# Patient Record
Sex: Female | Born: 1975 | Hispanic: Yes | Smoking: Former smoker
Health system: Southern US, Community
[De-identification: ages and names within clinical notes are randomized; demographics above are authoritative.]

## PROBLEM LIST (undated history)

## (undated) DIAGNOSIS — L309 Dermatitis, unspecified: Secondary | ICD-10-CM

## (undated) DIAGNOSIS — L509 Urticaria, unspecified: Secondary | ICD-10-CM

## (undated) DIAGNOSIS — I10 Essential (primary) hypertension: Secondary | ICD-10-CM

## (undated) DIAGNOSIS — J45909 Unspecified asthma, uncomplicated: Secondary | ICD-10-CM

## (undated) HISTORY — DX: Unspecified asthma, uncomplicated: J45.909

## (undated) HISTORY — DX: Urticaria, unspecified: L50.9

## (undated) HISTORY — DX: Dermatitis, unspecified: L30.9

---

## 2015-12-30 DIAGNOSIS — F4310 Post-traumatic stress disorder, unspecified: Secondary | ICD-10-CM | POA: Insufficient documentation

## 2016-02-10 DIAGNOSIS — F4310 Post-traumatic stress disorder, unspecified: Secondary | ICD-10-CM | POA: Diagnosis not present

## 2016-02-10 DIAGNOSIS — F331 Major depressive disorder, recurrent, moderate: Secondary | ICD-10-CM | POA: Diagnosis not present

## 2016-02-10 DIAGNOSIS — Z590 Homelessness: Secondary | ICD-10-CM | POA: Diagnosis not present

## 2016-02-16 DIAGNOSIS — F4323 Adjustment disorder with mixed anxiety and depressed mood: Secondary | ICD-10-CM | POA: Diagnosis not present

## 2016-02-27 DIAGNOSIS — Z590 Homelessness: Secondary | ICD-10-CM | POA: Diagnosis not present

## 2016-02-27 DIAGNOSIS — F4310 Post-traumatic stress disorder, unspecified: Secondary | ICD-10-CM | POA: Diagnosis not present

## 2016-02-27 DIAGNOSIS — F331 Major depressive disorder, recurrent, moderate: Secondary | ICD-10-CM | POA: Diagnosis not present

## 2016-03-09 DIAGNOSIS — F4323 Adjustment disorder with mixed anxiety and depressed mood: Secondary | ICD-10-CM | POA: Diagnosis not present

## 2016-03-22 DIAGNOSIS — F4323 Adjustment disorder with mixed anxiety and depressed mood: Secondary | ICD-10-CM | POA: Diagnosis not present

## 2016-04-11 DIAGNOSIS — G932 Benign intracranial hypertension: Secondary | ICD-10-CM | POA: Diagnosis not present

## 2016-04-11 DIAGNOSIS — F331 Major depressive disorder, recurrent, moderate: Secondary | ICD-10-CM | POA: Diagnosis not present

## 2016-04-11 DIAGNOSIS — Z590 Homelessness: Secondary | ICD-10-CM | POA: Diagnosis not present

## 2016-04-11 DIAGNOSIS — F4310 Post-traumatic stress disorder, unspecified: Secondary | ICD-10-CM | POA: Diagnosis not present

## 2016-04-12 DIAGNOSIS — Z91410 Personal history of adult physical and sexual abuse: Secondary | ICD-10-CM | POA: Diagnosis not present

## 2016-04-12 DIAGNOSIS — F331 Major depressive disorder, recurrent, moderate: Secondary | ICD-10-CM | POA: Diagnosis not present

## 2016-04-12 DIAGNOSIS — F4310 Post-traumatic stress disorder, unspecified: Secondary | ICD-10-CM | POA: Diagnosis not present

## 2016-04-13 DIAGNOSIS — Z713 Dietary counseling and surveillance: Secondary | ICD-10-CM | POA: Diagnosis not present

## 2016-04-13 DIAGNOSIS — J302 Other seasonal allergic rhinitis: Secondary | ICD-10-CM | POA: Diagnosis not present

## 2016-04-13 DIAGNOSIS — J4540 Moderate persistent asthma, uncomplicated: Secondary | ICD-10-CM | POA: Diagnosis not present

## 2016-04-13 DIAGNOSIS — E538 Deficiency of other specified B group vitamins: Secondary | ICD-10-CM | POA: Diagnosis not present

## 2016-04-13 DIAGNOSIS — Z7182 Exercise counseling: Secondary | ICD-10-CM | POA: Diagnosis not present

## 2016-04-13 DIAGNOSIS — E559 Vitamin D deficiency, unspecified: Secondary | ICD-10-CM | POA: Diagnosis not present

## 2016-04-13 DIAGNOSIS — Z6841 Body Mass Index (BMI) 40.0 and over, adult: Secondary | ICD-10-CM | POA: Diagnosis not present

## 2016-04-25 DIAGNOSIS — F331 Major depressive disorder, recurrent, moderate: Secondary | ICD-10-CM | POA: Diagnosis not present

## 2016-04-25 DIAGNOSIS — Z590 Homelessness: Secondary | ICD-10-CM | POA: Diagnosis not present

## 2016-04-25 DIAGNOSIS — G932 Benign intracranial hypertension: Secondary | ICD-10-CM | POA: Diagnosis not present

## 2016-04-25 DIAGNOSIS — F4310 Post-traumatic stress disorder, unspecified: Secondary | ICD-10-CM | POA: Diagnosis not present

## 2016-04-26 DIAGNOSIS — F4310 Post-traumatic stress disorder, unspecified: Secondary | ICD-10-CM | POA: Diagnosis not present

## 2016-04-26 DIAGNOSIS — F331 Major depressive disorder, recurrent, moderate: Secondary | ICD-10-CM | POA: Diagnosis not present

## 2016-05-07 DIAGNOSIS — F4310 Post-traumatic stress disorder, unspecified: Secondary | ICD-10-CM | POA: Diagnosis not present

## 2016-05-07 DIAGNOSIS — F331 Major depressive disorder, recurrent, moderate: Secondary | ICD-10-CM | POA: Diagnosis not present

## 2016-05-09 DIAGNOSIS — F4310 Post-traumatic stress disorder, unspecified: Secondary | ICD-10-CM | POA: Diagnosis not present

## 2016-05-09 DIAGNOSIS — G932 Benign intracranial hypertension: Secondary | ICD-10-CM | POA: Diagnosis not present

## 2016-05-09 DIAGNOSIS — F4323 Adjustment disorder with mixed anxiety and depressed mood: Secondary | ICD-10-CM | POA: Diagnosis not present

## 2016-05-09 DIAGNOSIS — F331 Major depressive disorder, recurrent, moderate: Secondary | ICD-10-CM | POA: Diagnosis not present

## 2016-05-09 DIAGNOSIS — Z590 Homelessness: Secondary | ICD-10-CM | POA: Diagnosis not present

## 2016-05-10 DIAGNOSIS — F331 Major depressive disorder, recurrent, moderate: Secondary | ICD-10-CM | POA: Diagnosis not present

## 2016-05-10 DIAGNOSIS — F4310 Post-traumatic stress disorder, unspecified: Secondary | ICD-10-CM | POA: Diagnosis not present

## 2016-06-04 DIAGNOSIS — F902 Attention-deficit hyperactivity disorder, combined type: Secondary | ICD-10-CM | POA: Diagnosis not present

## 2016-06-06 DIAGNOSIS — F4323 Adjustment disorder with mixed anxiety and depressed mood: Secondary | ICD-10-CM | POA: Diagnosis not present

## 2016-06-06 DIAGNOSIS — G932 Benign intracranial hypertension: Secondary | ICD-10-CM | POA: Diagnosis not present

## 2016-06-06 DIAGNOSIS — F4310 Post-traumatic stress disorder, unspecified: Secondary | ICD-10-CM | POA: Diagnosis not present

## 2016-06-06 DIAGNOSIS — F331 Major depressive disorder, recurrent, moderate: Secondary | ICD-10-CM | POA: Diagnosis not present

## 2016-06-06 DIAGNOSIS — Z590 Homelessness: Secondary | ICD-10-CM | POA: Diagnosis not present

## 2016-06-12 DIAGNOSIS — Z114 Encounter for screening for human immunodeficiency virus [HIV]: Secondary | ICD-10-CM | POA: Diagnosis not present

## 2016-06-12 DIAGNOSIS — Z118 Encounter for screening for other infectious and parasitic diseases: Secondary | ICD-10-CM | POA: Diagnosis not present

## 2016-06-12 DIAGNOSIS — L700 Acne vulgaris: Secondary | ICD-10-CM | POA: Diagnosis not present

## 2016-06-12 DIAGNOSIS — J45909 Unspecified asthma, uncomplicated: Secondary | ICD-10-CM | POA: Diagnosis not present

## 2016-06-12 DIAGNOSIS — Z0000 Encounter for general adult medical examination without abnormal findings: Secondary | ICD-10-CM | POA: Diagnosis not present

## 2016-06-15 DIAGNOSIS — F331 Major depressive disorder, recurrent, moderate: Secondary | ICD-10-CM | POA: Diagnosis not present

## 2016-06-15 DIAGNOSIS — F902 Attention-deficit hyperactivity disorder, combined type: Secondary | ICD-10-CM | POA: Diagnosis not present

## 2016-06-15 DIAGNOSIS — F4310 Post-traumatic stress disorder, unspecified: Secondary | ICD-10-CM | POA: Diagnosis not present

## 2016-06-28 DIAGNOSIS — F331 Major depressive disorder, recurrent, moderate: Secondary | ICD-10-CM | POA: Diagnosis not present

## 2016-06-28 DIAGNOSIS — F4310 Post-traumatic stress disorder, unspecified: Secondary | ICD-10-CM | POA: Diagnosis not present

## 2016-06-28 DIAGNOSIS — F902 Attention-deficit hyperactivity disorder, combined type: Secondary | ICD-10-CM | POA: Diagnosis not present

## 2016-07-12 DIAGNOSIS — F331 Major depressive disorder, recurrent, moderate: Secondary | ICD-10-CM | POA: Diagnosis not present

## 2016-07-12 DIAGNOSIS — F4310 Post-traumatic stress disorder, unspecified: Secondary | ICD-10-CM | POA: Diagnosis not present

## 2016-07-12 DIAGNOSIS — F902 Attention-deficit hyperactivity disorder, combined type: Secondary | ICD-10-CM | POA: Diagnosis not present

## 2016-09-27 DIAGNOSIS — G932 Benign intracranial hypertension: Secondary | ICD-10-CM | POA: Diagnosis not present

## 2016-09-27 DIAGNOSIS — J029 Acute pharyngitis, unspecified: Secondary | ICD-10-CM | POA: Diagnosis not present

## 2016-09-27 DIAGNOSIS — M797 Fibromyalgia: Secondary | ICD-10-CM | POA: Diagnosis not present

## 2016-09-27 DIAGNOSIS — Z880 Allergy status to penicillin: Secondary | ICD-10-CM | POA: Diagnosis not present

## 2016-11-23 DIAGNOSIS — Z880 Allergy status to penicillin: Secondary | ICD-10-CM | POA: Diagnosis not present

## 2016-11-23 DIAGNOSIS — J209 Acute bronchitis, unspecified: Secondary | ICD-10-CM | POA: Diagnosis not present

## 2016-11-23 DIAGNOSIS — M797 Fibromyalgia: Secondary | ICD-10-CM | POA: Diagnosis not present

## 2016-11-23 DIAGNOSIS — J45909 Unspecified asthma, uncomplicated: Secondary | ICD-10-CM | POA: Diagnosis not present

## 2017-02-13 ENCOUNTER — Emergency Department (HOSPITAL_COMMUNITY)
Admission: EM | Admit: 2017-02-13 | Discharge: 2017-02-13 | Disposition: A | Discharge status: Home / Self Care | Payer: Medicare Other | Attending: Emergency Medicine | Admitting: Emergency Medicine

## 2017-02-13 ENCOUNTER — Encounter (HOSPITAL_COMMUNITY): Payer: Self-pay

## 2017-02-13 DIAGNOSIS — Y999 Unspecified external cause status: Secondary | ICD-10-CM | POA: Diagnosis not present

## 2017-02-13 DIAGNOSIS — Y93G3 Activity, cooking and baking: Secondary | ICD-10-CM | POA: Diagnosis not present

## 2017-02-13 DIAGNOSIS — Y92000 Kitchen of unspecified non-institutional (private) residence as  the place of occurrence of the external cause: Secondary | ICD-10-CM | POA: Diagnosis not present

## 2017-02-13 DIAGNOSIS — X18XXXA Contact with other hot metals, initial encounter: Secondary | ICD-10-CM | POA: Insufficient documentation

## 2017-02-13 DIAGNOSIS — T23232A Burn of second degree of multiple left fingers (nail), not including thumb, initial encounter: Secondary | ICD-10-CM | POA: Insufficient documentation

## 2017-02-13 DIAGNOSIS — T23152A Burn of first degree of left palm, initial encounter: Secondary | ICD-10-CM | POA: Insufficient documentation

## 2017-02-13 DIAGNOSIS — T23192A Burn of first degree of multiple sites of left wrist and hand, initial encounter: Secondary | ICD-10-CM

## 2017-02-13 HISTORY — DX: Essential (primary) hypertension: I10

## 2017-02-13 MED ORDER — TRAMADOL HCL 50 MG PO TABS
50.0000 mg | ORAL_TABLET | Freq: Once | ORAL | Status: AC
  Administered 2017-02-13: 50 mg via ORAL
  Filled 2017-02-13: qty 1

## 2017-02-13 MED ORDER — SILVER SULFADIAZINE 1 % EX CREA
TOPICAL_CREAM | Freq: Once | CUTANEOUS | Status: AC
  Administered 2017-02-13: 03:00:00 via TOPICAL
  Filled 2017-02-13: qty 50

## 2017-02-13 MED ORDER — KETOROLAC TROMETHAMINE 60 MG/2ML IM SOLN
60.0000 mg | Freq: Once | INTRAMUSCULAR | Status: AC
  Administered 2017-02-13: 60 mg via INTRAMUSCULAR
  Filled 2017-02-13: qty 2

## 2017-02-13 MED ORDER — HYDROCODONE-ACETAMINOPHEN 5-325 MG PO TABS: 1.0000 | ORAL_TABLET | Freq: Four times a day (QID) | ORAL | 0 refills | Status: DC | PRN

## 2017-02-13 MED ORDER — TRAMADOL HCL 50 MG PO TABS: 50.0000 mg | ORAL_TABLET | Freq: Four times a day (QID) | ORAL | 0 refills | Status: DC | PRN

## 2017-02-13 NOTE — ED Notes (Signed)
Bed: WTR7 Expected date:  Expected time:  Means of arrival:  Comments: 

## 2017-02-13 NOTE — ED Provider Notes (Signed)
  [facility]-EMERGENCY DEPT Provider Note   CSN: [id number] Arrival date & time: 02/13/17  0211     History   Chief Complaint Chief Complaint  Patient presents with  . Hand Burn    HPI Christine Hanson is a 42 y.o. female.  Patient is a 42 year old female with a burn to her left hand.  She was pulling a hot baking pan out of the oven when she inadvertently touched the bottom of the pan.  She has burns to the palm of her hand and finger pads of all fingers.   The history is provided by the patient.    Past Medical History:  Diagnosis Date  . Hypertension     There are no active problems to display for this patient.   History reviewed. No pertinent surgical history.  OB History    No data available       Home Medications    Prior to Admission medications   Not on File    Family History History reviewed. No pertinent family history.  Social History Social History   Tobacco Use  . Smoking status: Never Smoker  . Smokeless tobacco: Never Used  Substance Use Topics  . Alcohol use: No    Frequency: Never  . Drug use: No     Allergies   Patient has no allergy information on record.   Review of Systems Review of Systems  All other systems reviewed and are negative.    Physical Exam Updated Vital Signs BP (!) 106/95   Pulse 81   Temp 98.4 F (36.9 C) (Oral)   LMP 02/11/2017   SpO2 100%   Physical Exam  Constitutional: She is oriented to person, place, and time. She appears well-developed and well-nourished. No distress.  HENT:  Head: Normocephalic and atraumatic.  Neck: Normal range of motion. Neck supple.  Neurological: She is alert and oriented to person, place, and time.  Skin: Skin is warm and dry. She is not diaphoretic.  The palmar aspect of the left hand has erythema consistent with first-degree burns.  There are also areas of mild blistering to the finger pad of the fourth and fifth fingers consistent with  mild second-degree burns.  Nursing note and vitals reviewed.    ED Treatments / Results  Labs (all labs ordered are listed, but only abnormal results are displayed) Labs Reviewed - No data to display  EKG  EKG Interpretation None       Radiology No results found.  Procedures Procedures (including critical care time)  Medications Ordered in ED Medications  silver sulfADIAZINE (SILVADENE) 1 % cream (not administered)  traMADol (ULTRAM) tablet 50 mg (not administered)     Initial Impression / Assessment and Plan / ED Course  I have reviewed the triage vital signs and the nursing notes.  Pertinent labs & imaging results that were available during my care of the patient were reviewed by me and considered in my medical decision making (see chart for details).  This will be treated with local wound care, tramadol, and follow-up as needed.  Final Clinical Impressions(s) / ED Diagnoses   Final diagnoses:  None    ED Discharge Orders    None       Geoffery Lyonselo, Anikka Marsan, MD 02/13/17 [phone]

## 2017-02-13 NOTE — Discharge Instructions (Signed)
Local wound care with Silvadene and dressing changes twice daily.  Ibuprofen 600mg  every six hours as prescribed as needed for pain.  Hydrocodone as prescribed as needed for pain not relieved with ibuprofen.  Follow-up with your primary doctor if not improving in the next 3-4 days.

## 2017-02-13 NOTE — ED Triage Notes (Signed)
Pt burned the tips of her left fingers and palm of hand on an oven rack

## 2017-02-14 ENCOUNTER — Ambulatory Visit (INDEPENDENT_AMBULATORY_CARE_PROVIDER_SITE_OTHER): Payer: PRIVATE HEALTH INSURANCE | Admitting: Neurology

## 2017-02-14 ENCOUNTER — Encounter: Payer: Self-pay | Admitting: Neurology

## 2017-02-14 VITALS — BP 134/69 | HR 74 | Ht 67.0 in | Wt 267.0 lb

## 2017-02-14 DIAGNOSIS — M797 Fibromyalgia: Secondary | ICD-10-CM

## 2017-02-14 DIAGNOSIS — G932 Benign intracranial hypertension: Secondary | ICD-10-CM | POA: Diagnosis not present

## 2017-02-14 MED ORDER — TOPIRAMATE 50 MG PO TABS: 100.0000 mg | ORAL_TABLET | Freq: Every day | ORAL | 11 refills | Status: DC

## 2017-02-14 MED ORDER — RIZATRIPTAN BENZOATE 10 MG PO TBDP: 10.0000 mg | ORAL_TABLET | ORAL | 6 refills | Status: DC | PRN

## 2017-02-14 MED ORDER — PREGABALIN 100 MG PO CAPS: 100.0000 mg | ORAL_CAPSULE | Freq: Every day | ORAL | 5 refills | Status: DC

## 2017-02-14 NOTE — Progress Notes (Signed)
PATIENT: Christine Hanson DOB: [birth year]  Chief Complaint  Patient presents with  . New Patient (Initial Visit)     HISTORICAL  Christine Hanson is a 42 year old female, seen in refer by vocational rehabilitation service for evaluation of possible intracranial hypertension, initial evaluation was on February 14, 2017.  She recently moved from [place] to [place] in November 2018, she is here to establish her care, she was diagnosed with intracranial hypertension since 2005, also fibromyalgia, was on disability,  Per patient, who presented with frequent headaches, decreased peripheral visual field, there was CSF leaking from her ears, diagnosis of intracranial hypertension was confirmed by repetitive lumbar puncture, she has multiple hospital admission, repeat lumbar puncture 4-5 times each year initially, she was also treated with Diamox 500 mg daily, eventually was tapered of around 2017, because of her stable symptoms, no significant visual complaints anymore.  She was also treated with Lyrica up to 900 mg daily carried a diagnosis of fibromyalgia, she suffered morbid obesity, is seen by the metabolic clinic, with her diet control, she has 30 weight loss over the past few years.  She now complains of frequent headaches, bilateral frontal, temporal region severe pounding headaches with associated light noise sensitivity, nauseous, for that reason, she is taking daily Sudafed, allergy pill, herb teas which has helped her headache some   REVIEW OF SYSTEMS: Full 14 system review of systems performed and notable only for restless leg, joint pain, cramps, aching muscles, feeling cold, ringing the ears   ALLERGIES: Allergies not on file  HOME MEDICATIONS: Current Outpatient Medications  Medication Sig Dispense Refill  . HYDROcodone-acetaminophen (NORCO) 5-325 MG tablet Take 1-2 tablets by mouth every 6 (six) hours as needed. 10 tablet 0   No current  facility-administered medications for this visit.     PAST MEDICAL HISTORY: Past Medical History:  Diagnosis Date  . Hypertension     PAST SURGICAL HISTORY: No past surgical history on file.  FAMILY HISTORY: No family history on file.  SOCIAL HISTORY:  Social History   Socioeconomic History  . Marital status: Single    Spouse name: Not on file  . Number of children: Not on file  . Years of education: Not on file  . Highest education level: Not on file  Social Needs  . Financial resource strain: Not on file  . Food insecurity - worry: Not on file  . Food insecurity - inability: Not on file  . Transportation needs - medical: Not on file  . Transportation needs - non-medical: Not on file  Occupational History  . Not on file  Tobacco Use  . Smoking status: Never Smoker  . Smokeless tobacco: Never Used  Substance and Sexual Activity  . Alcohol use: No    Frequency: Never  . Drug use: No  . Sexual activity: Not on file  Other Topics Concern  . Not on file  Social History Narrative  . Not on file     PHYSICAL EXAM   Vitals:   02/14/17 1503  BP: 134/69  Pulse: 74  Weight: 267 lb (121.1 kg)  Height: 5\' 7"  (1.702 m)    Not recorded      Body mass index is 41.82 kg/m.  PHYSICAL EXAMNIATION:  Gen: NAD, conversant, well nourised, obese, well groomed                     Cardiovascular: Regular rate rhythm, no peripheral edema, warm, nontender. Eyes: Conjunctivae clear without exudates  or hemorrhage Neck: Supple, no carotid bruits. Pulmonary: Clear to auscultation bilaterally   NEUROLOGICAL EXAM:  MENTAL STATUS: Speech:    Speech is normal; fluent and spontaneous with normal comprehension.  Cognition:     Orientation to time, place and person     Normal recent and remote memory     Normal Attention span and concentration     Normal Language, naming, repeating,spontaneous speech     Fund of knowledge   CRANIAL NERVES: CN II: Visual fields are full  to confrontation. Fundoscopic exam is normal with sharp discs and no vascular changes. Pupils are round equal and briskly reactive to light. CN III, IV, VI: extraocular movement are normal. No ptosis. CN V: Facial sensation is intact to pinprick in all 3 divisions bilaterally. Corneal responses are intact.  CN VII: Face is symmetric with normal eye closure and smile. CN VIII: Hearing is normal to rubbing fingers CN IX, X: Palate elevates symmetrically. Phonation is normal. CN XI: Head turning and shoulder shrug are intact CN XII: Tongue is midline with normal movements and no atrophy.  MOTOR: There is no pronator drift of out-stretched arms. Muscle bulk and tone are normal. Muscle strength is normal.  REFLEXES: Reflexes are 2+ and symmetric at the biceps, triceps, knees, and ankles. Plantar responses are flexor.  SENSORY: Intact to light touch, pinprick, positional sensation and vibratory sensation are intact in fingers and toes.  COORDINATION: Rapid alternating movements and fine finger movements are intact. There is no dysmetria on finger-to-nose and heel-knee-shin.    GAIT/STANCE: Posture is normal. Gait is steady with normal steps, base, arm swing, and turning. Heel and toe walking are normal. Tandem gait is normal.  Romberg is absent.   DIAGNOSTIC DATA (LABS, IMAGING, TESTING) - I reviewed patient records, labs, notes, testing and imaging myself where available.   ASSESSMENT AND PLAN  Christine Hanson is a 42 y.o. female   History of pseudotumor febrile, now has frequent headache with migraine features Fibromyalgia  Topamax 50 mg titrating to 100 mg every night as a preventive medication  Laboratory evaluation to rule out treatable etiology  Lyrica 100 mg every night  Maxalt 10 mg as needed    Levert FeinsteinYijun Cordaro Mukai, M.D. Ph.D.  [facility] [address], [address] [place], [place] [address] Ph: [phone] Fax: [phone]  CC:  Vocational  Rehabllitation Service, phone #[phone], fax #[phone]

## 2017-02-15 ENCOUNTER — Telehealth: Payer: Self-pay | Admitting: Neurology

## 2017-02-15 LAB — COMPREHENSIVE METABOLIC PANEL
A/G RATIO: 1.2 (ref 1.2–2.2)
ALK PHOS: 77 IU/L (ref 39–117)
ALT: 16 IU/L (ref 0–32)
AST: 25 IU/L (ref 0–40)
Albumin: 3.9 g/dL (ref 3.5–5.5)
BUN/Creatinine Ratio: 15 (ref 9–23)
BUN: 9 mg/dL (ref 6–24)
Bilirubin Total: <0.2 mg/dL (ref 0.0–1.2)
CHLORIDE: 99 mmol/L (ref 96–106)
CO2: 26 mmol/L (ref 20–29)
Calcium: 9.4 mg/dL (ref 8.7–10.2)
Creatinine, Ser: 0.59 mg/dL (ref 0.57–1.00)
GFR calc non Af Amer: 113 mL/min/{1.73_m2} (ref >59)
GFR, EST AFRICAN AMERICAN: 130 mL/min/{1.73_m2} (ref >59)
Globulin, Total: 3.2 g/dL (ref 1.5–4.5)
Glucose: 93 mg/dL (ref 65–99)
POTASSIUM: 4.4 mmol/L (ref 3.5–5.2)
Sodium: 141 mmol/L (ref 134–144)
Total Protein: 7.1 g/dL (ref 6.0–8.5)

## 2017-02-15 LAB — CBC WITH DIFFERENTIAL/PLATELET
BASOS: 0 %
Basophils Absolute: 0 10*3/uL (ref 0.0–0.2)
EOS (ABSOLUTE): 0.1 10*3/uL (ref 0.0–0.4)
Eos: 1 %
Hematocrit: 38.4 % (ref 34.0–46.6)
Hemoglobin: 12.7 g/dL (ref 11.1–15.9)
Immature Grans (Abs): 0 10*3/uL (ref 0.0–0.1)
Immature Granulocytes: 0 %
Lymphocytes Absolute: 2.8 10*3/uL (ref 0.7–3.1)
Lymphs: 32 %
MCH: 26.5 pg — AB (ref 26.6–33.0)
MCHC: 33.1 g/dL (ref 31.5–35.7)
MCV: 80 fL (ref 79–97)
MONOS ABS: 0.4 10*3/uL (ref 0.1–0.9)
Monocytes: 5 %
NEUTROS ABS: 5.3 10*3/uL (ref 1.4–7.0)
NEUTROS PCT: 62 %
PLATELETS: 167 10*3/uL (ref 150–379)
RBC: 4.79 x10E6/uL (ref 3.77–5.28)
RDW: 15.2 % (ref 12.3–15.4)
WBC: 8.7 10*3/uL (ref 3.4–10.8)

## 2017-02-15 LAB — SEDIMENTATION RATE: Sed Rate: 29 mm/hr (ref 0–32)

## 2017-02-15 LAB — VITAMIN B12: Vitamin B-12: 255 pg/mL (ref 232–1245)

## 2017-02-15 LAB — C-REACTIVE PROTEIN: CRP: 14.7 mg/L — ABNORMAL HIGH (ref 0.0–4.9)

## 2017-02-15 LAB — ANA W/REFLEX: ANA: NEGATIVE

## 2017-02-15 LAB — VITAMIN D 25 HYDROXY (VIT D DEFICIENCY, FRACTURES): Vit D, 25-Hydroxy: 33.9 ng/mL (ref 30.0–100.0)

## 2017-02-15 LAB — TSH: TSH: 3.53 u[IU]/mL (ref 0.450–4.500)

## 2017-02-15 LAB — CK: CK TOTAL: 150 U/L (ref 24–173)

## 2017-02-15 NOTE — Telephone Encounter (Signed)
Laboratory evaluation showed mild elevated C-reactive protein, which is unknown clinical significance, rest of the laboratory evaluations were normal.

## 2017-02-18 NOTE — Telephone Encounter (Signed)
Spoke to patient she is aware of results

## 2017-03-20 ENCOUNTER — Ambulatory Visit: Payer: PRIVATE HEALTH INSURANCE | Admitting: Neurology

## 2017-04-01 ENCOUNTER — Ambulatory Visit (INDEPENDENT_AMBULATORY_CARE_PROVIDER_SITE_OTHER): Payer: Medicare Other | Admitting: Neurology

## 2017-04-01 ENCOUNTER — Encounter: Payer: Self-pay | Admitting: Neurology

## 2017-04-01 VITALS — BP 144/96 | HR 92 | Ht 67.0 in | Wt 267.5 lb

## 2017-04-01 DIAGNOSIS — R51 Headache: Secondary | ICD-10-CM

## 2017-04-01 DIAGNOSIS — G932 Benign intracranial hypertension: Secondary | ICD-10-CM | POA: Diagnosis not present

## 2017-04-01 DIAGNOSIS — G43709 Chronic migraine without aura, not intractable, without status migrainosus: Secondary | ICD-10-CM | POA: Diagnosis not present

## 2017-04-01 DIAGNOSIS — IMO0002 Reserved for concepts with insufficient information to code with codable children: Secondary | ICD-10-CM

## 2017-04-01 DIAGNOSIS — R519 Headache, unspecified: Secondary | ICD-10-CM

## 2017-04-01 MED ORDER — RIZATRIPTAN BENZOATE 10 MG PO TBDP: 10.0000 mg | ORAL_TABLET | ORAL | 6 refills | Status: DC | PRN

## 2017-04-01 MED ORDER — TOPIRAMATE 50 MG PO TABS: 100.0000 mg | ORAL_TABLET | Freq: Every day | ORAL | 11 refills | Status: DC

## 2017-04-01 MED ORDER — PREGABALIN 100 MG PO CAPS: 100.0000 mg | ORAL_CAPSULE | Freq: Every day | ORAL | 5 refills | Status: DC

## 2017-04-01 NOTE — Progress Notes (Signed)
PATIENT: Christine Hanson DOB: Sep 15, 1975  Chief Complaint  Patient presents with  . Follow-up    Pt has not established with a PCP yet. Pt goes to [facility] for her [place], see Dr. Joneen Caraway.   . Intracranial hypertension    Patient had a severe allergic to Lamictal. Behavioral Health would like Dr. Rhea Belton opinion on what she can take with her [facility]  . Headache    Pt reports having a mild headache every day.   . Other    Pt reports that when she feels her pressure going up, her right ear will start draining fluid.      HISTORICAL  Christine Hanson is a 42 year old female, seen in refer by vocational rehabilitation service for evaluation of possible intracranial hypertension, initial evaluation was on February 14, 2017.  She recently moved from [place] to [place] in November 2018, she is here to establish her care, she was diagnosed with intracranial hypertension since 2005, also fibromyalgia, was on disability,  Per patient, who presented with frequent headaches, decreased peripheral visual field, there was CSF leaking from her ears, diagnosis of intracranial hypertension was confirmed by repetitive lumbar puncture, she has multiple hospital admission, repeat lumbar puncture 4-5 times each year initially, she was also treated with Diamox 500 mg daily, eventually was tapered of around 2017, because of her stable symptoms, no significant visual complaints anymore.  She was also treated with Lyrica up to 900 mg daily carried a diagnosis of fibromyalgia, she suffered morbid obesity, is seen by the metabolic clinic, with her diet control, she has 30 weight loss over the past few years.  She now complains of frequent headaches, bilateral frontal, temporal region severe pounding headaches with associated light noise sensitivity, nauseous, for that reason, she is taking daily Sudafed, allergy pill, herb teas have helped her headache some  UPDATE April 01 2017: Laboratory evaluations in  January 2019 showed normal CPK, TSH, ESR, ANA, vitamin D, B12, CMP, CBC -reactive protein was mildly elevated at 14.7,  She was seen by behavior health, was started on wellbutrin '400mg'$  daily with diagnosis of depression.  She was treated with lamotrigine, she developed rash.  Previously Topamax '150mg'$  qhs, she felt stuttering, could nto rember her wors.    I have given her a prescription of Lyrica, Maxalt as needed, and Topamax prescription at last visit in January 2019, she did not feel any of the prescription, continue complaints of frequent headaches, seem to correlate with his right ear draining,  I also feel the vocational rehabilitation paperwork for her today,  REVIEW OF SYSTEMS: Full 14 system review of systems performed and notable only for restless leg, joint pain, cramps, aching muscles, feeling cold, ringing the ears   ALLERGIES: Allergies  Allergen Reactions  . Lamictal [Lamotrigine]     Severe migraine AND rash    HOME MEDICATIONS: Current Outpatient Medications  Medication Sig Dispense Refill  . buPROPion (WELLBUTRIN SR) 100 MG 12 hr tablet Take 100 mg by mouth daily.    Marland Kitchen buPROPion (WELLBUTRIN XL) 300 MG 24 hr tablet Take 300 mg by mouth daily.    . pregabalin (LYRICA) 100 MG capsule Take 1 capsule (100 mg total) by mouth at bedtime. (Patient not taking: Reported on 04/01/2017) 30 capsule 5  . rizatriptan (MAXALT-MLT) 10 MG disintegrating tablet Take 1 tablet (10 mg total) by mouth as needed. May repeat in 2 hours if needed (Patient not taking: Reported on 04/01/2017) 15 tablet 6  . topiramate (TOPAMAX) 50  MG tablet Take 2 tablets (100 mg total) by mouth at bedtime. (Patient not taking: Reported on 04/01/2017) 60 tablet 11   No current facility-administered medications for this visit.     PAST MEDICAL HISTORY: Past Medical History:  Diagnosis Date  . Hypertension     PAST SURGICAL HISTORY: No past surgical history on file.  FAMILY HISTORY: No family history on  file.  SOCIAL HISTORY:  Social History   Socioeconomic History  . Marital status: Single    Spouse name: Not on file  . Number of children: Not on file  . Years of education: Not on file  . Highest education level: Not on file  Social Needs  . Financial resource strain: Not on file  . Food insecurity - worry: Not on file  . Food insecurity - inability: Not on file  . Transportation needs - medical: Not on file  . Transportation needs - non-medical: Not on file  Occupational History  . Not on file  Tobacco Use  . Smoking status: Never Smoker  . Smokeless tobacco: Never Used  Substance and Sexual Activity  . Alcohol use: No    Frequency: Never  . Drug use: No  . Sexual activity: Not on file  Other Topics Concern  . Not on file  Social History Narrative  . Not on file     PHYSICAL EXAM   Vitals:   04/01/17 1057  BP: (!) 144/96  Pulse: 92  Weight: 267 lb 8 oz (121.3 kg)  Height: '5\' 7"'$  (1.702 m)    Not recorded      Body mass index is 41.9 kg/m.  PHYSICAL EXAMNIATION:  Gen: NAD, conversant, well nourised, obese, well groomed                     Cardiovascular: Regular rate rhythm, no peripheral edema, warm, nontender. Eyes: Conjunctivae clear without exudates or hemorrhage Neck: Supple, no carotid bruits. Pulmonary: Clear to auscultation bilaterally   NEUROLOGICAL EXAM:  MENTAL STATUS: Speech:    Speech is normal; fluent and spontaneous with normal comprehension.  Cognition:     Orientation to time, place and person     Normal recent and remote memory     Normal Attention span and concentration     Normal Language, naming, repeating,spontaneous speech     Fund of knowledge   CRANIAL NERVES: CN II: Visual fields are full to confrontation. Fundoscopic exam is normal with sharp discs and no vascular changes. Pupils are round equal and briskly reactive to light. CN III, IV, VI: extraocular movement are normal. No ptosis. CN V: Facial sensation is  intact to pinprick in all 3 divisions bilaterally. Corneal responses are intact.  CN VII: Face is symmetric with normal eye closure and smile. CN VIII: Right ear external auditory canal oily cerumen,  CN IX, X: Palate elevates symmetrically. Phonation is normal. CN XI: Head turning and shoulder shrug are intact CN XII: Tongue is midline with normal movements and no atrophy.  MOTOR: There is no pronator drift of out-stretched arms. Muscle bulk and tone are normal. Muscle strength is normal.  REFLEXES: Reflexes are 2+ and symmetric at the biceps, triceps, knees, and ankles. Plantar responses are flexor.  SENSORY: Intact to light touch, pinprick, positional sensation and vibratory sensation are intact in fingers and toes.  COORDINATION: Rapid alternating movements and fine finger movements are intact. There is no dysmetria on finger-to-nose and heel-knee-shin.    GAIT/STANCE: Posture is normal. Gait is steady  with normal steps, base, arm swing, and turning. Heel and toe walking are normal. Tandem gait is normal.  Romberg is absent.   DIAGNOSTIC DATA (LABS, IMAGING, TESTING) - I reviewed patient records, labs, notes, testing and imaging myself where available.   ASSESSMENT AND PLAN  Christine Hanson is a 42 y.o. female   History of pseudotumor cerebri,now has frequent headache with migraine features Fibromyalgia  Topamax 50 mg titrating to 100 mg every night as a preventive medication  Lyrica 100 mg every night  Maxalt 10 mg as needed  MRI of brain, potential lumbar puncture if she still has frequent headaches, fundoscopy examination showed no significant papillary edema    Marcial Pacas, M.D. Ph.D.  [facility] [address] [place], [place] [address] Ph: [phone] Fax: [phone]  CC:  Vocational Rehabllitation Service, phone [phone], fax [phone]

## 2017-04-02 ENCOUNTER — Encounter: Payer: Self-pay | Admitting: Neurology

## 2017-04-08 DIAGNOSIS — F902 Attention-deficit hyperactivity disorder, combined type: Secondary | ICD-10-CM | POA: Diagnosis not present

## 2017-04-08 DIAGNOSIS — F329 Major depressive disorder, single episode, unspecified: Secondary | ICD-10-CM | POA: Diagnosis not present

## 2017-04-14 ENCOUNTER — Other Ambulatory Visit: Payer: Medicare Other

## 2017-04-15 DIAGNOSIS — F329 Major depressive disorder, single episode, unspecified: Secondary | ICD-10-CM | POA: Diagnosis not present

## 2017-04-15 DIAGNOSIS — F902 Attention-deficit hyperactivity disorder, combined type: Secondary | ICD-10-CM | POA: Diagnosis not present

## 2017-04-17 DIAGNOSIS — F329 Major depressive disorder, single episode, unspecified: Secondary | ICD-10-CM | POA: Diagnosis not present

## 2017-04-17 DIAGNOSIS — F902 Attention-deficit hyperactivity disorder, combined type: Secondary | ICD-10-CM | POA: Diagnosis not present

## 2017-04-19 DIAGNOSIS — F329 Major depressive disorder, single episode, unspecified: Secondary | ICD-10-CM | POA: Diagnosis not present

## 2017-04-19 DIAGNOSIS — F902 Attention-deficit hyperactivity disorder, combined type: Secondary | ICD-10-CM | POA: Diagnosis not present

## 2017-04-24 DIAGNOSIS — F329 Major depressive disorder, single episode, unspecified: Secondary | ICD-10-CM | POA: Diagnosis not present

## 2017-04-24 DIAGNOSIS — F902 Attention-deficit hyperactivity disorder, combined type: Secondary | ICD-10-CM | POA: Diagnosis not present

## 2017-04-26 ENCOUNTER — Ambulatory Visit
Admission: RE | Admit: 2017-04-26 | Discharge: 2017-04-26 | Disposition: A | Discharge status: Home / Self Care | Payer: Medicare Other | Source: Ambulatory Visit | Attending: Neurology | Admitting: Neurology

## 2017-04-26 DIAGNOSIS — R519 Headache, unspecified: Secondary | ICD-10-CM

## 2017-04-26 DIAGNOSIS — R51 Headache: Secondary | ICD-10-CM | POA: Diagnosis not present

## 2017-04-29 ENCOUNTER — Other Ambulatory Visit: Payer: Self-pay | Admitting: *Deleted

## 2017-04-29 ENCOUNTER — Telehealth: Payer: Self-pay | Admitting: Neurology

## 2017-04-29 DIAGNOSIS — H9211 Otorrhea, right ear: Secondary | ICD-10-CM

## 2017-04-29 MED ORDER — VENLAFAXINE HCL 37.5 MG PO TABS: 37.5000 mg | ORAL_TABLET | Freq: Two times a day (BID) | ORAL | 11 refills | Status: DC

## 2017-04-29 NOTE — Addendum Note (Signed)
Addended by: Lilla ShookKIRKMAN, Baruc Tugwell C on: 04/29/2017 04:33 PM   Modules accepted: Orders

## 2017-04-29 NOTE — Telephone Encounter (Signed)
Spoke to patient - she is aware of the MRI results and verbalized understanding.    While on the phone, the patient stated she is unable to tolerate topiramate due to cognitive fog.  Per vo by Dr. Terrace ArabiaYan, discontinue topiramate and start venlafaxine 37.5mg , one tablet twice daily.  Pt is agreeable to this medication change.  New rx sent to pharmacy.  Pt is also concerned about the continued drainage from her right ear.  Per vo by Dr. Terrace ArabiaYan, ok to refer to ENT.  Orders placed in Epic.  Pt aware to expect a call for scheduling.  Also, says her behavioral health physician is going to be starting her on a stimulant and she wanted to know if that type of medication would be contraindicated with her condition.  Per vo by Dr. Terrace ArabiaYan, it is okay for her to start a stimulant.

## 2017-04-29 NOTE — Addendum Note (Signed)
Addended by: Lindell SparKIRKMAN, MICHELLE C on: 04/29/2017 04:27 PM   Modules accepted: Orders

## 2017-04-29 NOTE — Addendum Note (Signed)
Addended by: Lindell SparKIRKMAN, Cebastian Neis C on: 04/29/2017 04:29 PM   Modules accepted: Orders

## 2017-04-29 NOTE — Telephone Encounter (Signed)
MRI of the brain showed no significant intracranial abnormality, partial empty sella, this can be related to pseudotumor cerebri, multiple subcutaneous cysts, most likely sebaceous cyst,   IMPRESSION: This noncontrasted MRI of the brain shows the following: 1.    There are a couple small T2/FLAIR hyperintense foci in the hemispheres consistent with minimal chronic microvascular ischemic change or the sequela of prior migraine or inflammation.  None of these appear to be acute. 2.    Chronic left maxillary sinusitis. 3.    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  This is usually an incidental finding without significance but can also be seen in the setting of elevated intracranial pressure.   4.    Multiple subcutaneous cysts, some as large as 24 mm.  These are most likely sebaceous cysts though other cystic structures cannot be ruled out.  If painful or changing in size, consider resection.

## 2017-04-30 DIAGNOSIS — F902 Attention-deficit hyperactivity disorder, combined type: Secondary | ICD-10-CM | POA: Diagnosis not present

## 2017-04-30 DIAGNOSIS — F4310 Post-traumatic stress disorder, unspecified: Secondary | ICD-10-CM | POA: Diagnosis not present

## 2017-04-30 DIAGNOSIS — F329 Major depressive disorder, single episode, unspecified: Secondary | ICD-10-CM | POA: Diagnosis not present

## 2017-05-01 DIAGNOSIS — F902 Attention-deficit hyperactivity disorder, combined type: Secondary | ICD-10-CM | POA: Diagnosis not present

## 2017-05-01 DIAGNOSIS — F329 Major depressive disorder, single episode, unspecified: Secondary | ICD-10-CM | POA: Diagnosis not present

## 2017-05-03 ENCOUNTER — Other Ambulatory Visit: Payer: Medicare Other

## 2017-05-08 DIAGNOSIS — F329 Major depressive disorder, single episode, unspecified: Secondary | ICD-10-CM | POA: Diagnosis not present

## 2017-05-08 DIAGNOSIS — F902 Attention-deficit hyperactivity disorder, combined type: Secondary | ICD-10-CM | POA: Diagnosis not present

## 2017-05-15 DIAGNOSIS — F329 Major depressive disorder, single episode, unspecified: Secondary | ICD-10-CM | POA: Diagnosis not present

## 2017-05-15 DIAGNOSIS — F902 Attention-deficit hyperactivity disorder, combined type: Secondary | ICD-10-CM | POA: Diagnosis not present

## 2017-05-16 ENCOUNTER — Ambulatory Visit: Payer: PRIVATE HEALTH INSURANCE | Admitting: Neurology

## 2017-05-21 ENCOUNTER — Telehealth: Payer: Self-pay | Admitting: Neurology

## 2017-05-21 NOTE — Telephone Encounter (Signed)
Called patient to discuss rescheduling appointment on 9/4 with MM to 9/4 with CM to open MM's schedule, while on the phone patient stated that there is a problem with her medication and requested that Dr. Zannie CoveYan's nurse call her. Patient stated that she spoke with her pharmacy about a month ago and the pharmacy told her that they never received the prescription from Dr. Terrace ArabiaYan.

## 2017-05-21 NOTE — Telephone Encounter (Signed)
Spoke to patient - venlafaxine 37.5mg  BID was sent electronically on 04/29/17.  Pt states pharmacy told her it was never received.  [place] [facility] [place] today and spoke to [place]ie who confirmed the prescription is there and ready for pick up.  Called patient back and she will get it today.

## 2017-05-28 DIAGNOSIS — F902 Attention-deficit hyperactivity disorder, combined type: Secondary | ICD-10-CM | POA: Diagnosis not present

## 2017-05-28 DIAGNOSIS — F4310 Post-traumatic stress disorder, unspecified: Secondary | ICD-10-CM | POA: Diagnosis not present

## 2017-06-12 DIAGNOSIS — F329 Major depressive disorder, single episode, unspecified: Secondary | ICD-10-CM | POA: Diagnosis not present

## 2017-06-12 DIAGNOSIS — F902 Attention-deficit hyperactivity disorder, combined type: Secondary | ICD-10-CM | POA: Diagnosis not present

## 2017-06-18 DIAGNOSIS — F329 Major depressive disorder, single episode, unspecified: Secondary | ICD-10-CM | POA: Diagnosis not present

## 2017-06-18 DIAGNOSIS — F902 Attention-deficit hyperactivity disorder, combined type: Secondary | ICD-10-CM | POA: Diagnosis not present

## 2017-06-19 DIAGNOSIS — F902 Attention-deficit hyperactivity disorder, combined type: Secondary | ICD-10-CM | POA: Diagnosis not present

## 2017-06-19 DIAGNOSIS — F329 Major depressive disorder, single episode, unspecified: Secondary | ICD-10-CM | POA: Diagnosis not present

## 2017-06-25 DIAGNOSIS — F4310 Post-traumatic stress disorder, unspecified: Secondary | ICD-10-CM | POA: Diagnosis not present

## 2017-06-25 DIAGNOSIS — F329 Major depressive disorder, single episode, unspecified: Secondary | ICD-10-CM | POA: Diagnosis not present

## 2017-07-02 DIAGNOSIS — F329 Major depressive disorder, single episode, unspecified: Secondary | ICD-10-CM | POA: Diagnosis not present

## 2017-07-02 DIAGNOSIS — F4310 Post-traumatic stress disorder, unspecified: Secondary | ICD-10-CM | POA: Diagnosis not present

## 2017-07-02 DIAGNOSIS — F902 Attention-deficit hyperactivity disorder, combined type: Secondary | ICD-10-CM | POA: Diagnosis not present

## 2017-07-09 DIAGNOSIS — F329 Major depressive disorder, single episode, unspecified: Secondary | ICD-10-CM | POA: Diagnosis not present

## 2017-07-09 DIAGNOSIS — F4310 Post-traumatic stress disorder, unspecified: Secondary | ICD-10-CM | POA: Diagnosis not present

## 2017-07-09 DIAGNOSIS — F902 Attention-deficit hyperactivity disorder, combined type: Secondary | ICD-10-CM | POA: Diagnosis not present

## 2017-07-23 DIAGNOSIS — F329 Major depressive disorder, single episode, unspecified: Secondary | ICD-10-CM | POA: Diagnosis not present

## 2017-07-23 DIAGNOSIS — F4310 Post-traumatic stress disorder, unspecified: Secondary | ICD-10-CM | POA: Diagnosis not present

## 2017-07-23 DIAGNOSIS — F902 Attention-deficit hyperactivity disorder, combined type: Secondary | ICD-10-CM | POA: Diagnosis not present

## 2017-08-09 DIAGNOSIS — F329 Major depressive disorder, single episode, unspecified: Secondary | ICD-10-CM | POA: Diagnosis not present

## 2017-08-09 DIAGNOSIS — F902 Attention-deficit hyperactivity disorder, combined type: Secondary | ICD-10-CM | POA: Diagnosis not present

## 2017-08-09 DIAGNOSIS — F4310 Post-traumatic stress disorder, unspecified: Secondary | ICD-10-CM | POA: Diagnosis not present

## 2017-10-02 ENCOUNTER — Ambulatory Visit: Payer: PRIVATE HEALTH INSURANCE | Admitting: Adult Health

## 2017-10-02 ENCOUNTER — Ambulatory Visit: Payer: Medicare Other | Admitting: Nurse Practitioner

## 2017-10-10 DIAGNOSIS — F4310 Post-traumatic stress disorder, unspecified: Secondary | ICD-10-CM | POA: Diagnosis not present

## 2017-10-10 DIAGNOSIS — F329 Major depressive disorder, single episode, unspecified: Secondary | ICD-10-CM | POA: Diagnosis not present

## 2017-10-10 DIAGNOSIS — F902 Attention-deficit hyperactivity disorder, combined type: Secondary | ICD-10-CM | POA: Diagnosis not present

## 2017-10-24 DIAGNOSIS — F902 Attention-deficit hyperactivity disorder, combined type: Secondary | ICD-10-CM | POA: Diagnosis not present

## 2017-10-24 DIAGNOSIS — F4310 Post-traumatic stress disorder, unspecified: Secondary | ICD-10-CM | POA: Diagnosis not present

## 2017-10-24 DIAGNOSIS — F329 Major depressive disorder, single episode, unspecified: Secondary | ICD-10-CM | POA: Diagnosis not present

## 2017-11-28 DIAGNOSIS — F329 Major depressive disorder, single episode, unspecified: Secondary | ICD-10-CM | POA: Diagnosis not present

## 2017-11-28 DIAGNOSIS — F902 Attention-deficit hyperactivity disorder, combined type: Secondary | ICD-10-CM | POA: Diagnosis not present

## 2017-11-28 DIAGNOSIS — F4310 Post-traumatic stress disorder, unspecified: Secondary | ICD-10-CM | POA: Diagnosis not present

## 2018-01-26 NOTE — Progress Notes (Deleted)
[facility]  PATIENT: Christine Hanson DOB: 1975/10/10   REASON FOR VISIT: *** HISTORY FROM:    HISTORY OF PRESENT ILLNESS:  3/19/19This noncontrasted MRI of the brain shows the following: 1.    There are a couple small T2/FLAIR hyperintense foci in the hemispheres consistent with minimal chronic microvascular ischemic change or the sequela of prior migraine or inflammation.  None of these appear to be acute. 2.    Chronic left maxillary sinusitis. 3.    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  This is usually an incidental finding without significance but can also be seen in the setting of elevated intracranial pressure.   4.    Multiple subcutaneous cysts, some as large as 24 mm.  These are most likely sebaceous cysts though other cystic structures cannot be ruled out.  If painful or changing in size, consider resection.   Christine Hanson is a 42 year old female, seen in refer by vocational rehabilitation service for evaluation of possible intracranial hypertension, initial evaluation was on February 14, 2017.  She recently moved from [place] to [place] in November 2018, she is here to establish her care, she was diagnosed with intracranial hypertension since 2005, also fibromyalgia, was on disability,  Per patient, who presented with frequent headaches, decreased peripheral visual field, there was CSF leaking from her ears, diagnosis of intracranial hypertension was confirmed by repetitive lumbar puncture, she has multiple hospital admission, repeat lumbar puncture 4-5 times each year initially, she was also treated with Diamox 500 mg daily, eventually was tapered of around 2017, because of her stable symptoms, no significant visual complaints anymore.  She was also treated with Lyrica up to 900 mg daily carried a diagnosis of fibromyalgia, she suffered morbid obesity, is seen by the metabolic clinic, with her diet control, she has 30 weight loss  over the past few years.  She now complains of frequent headaches, bilateral frontal, temporal region severe pounding headaches with associated light noise sensitivity, nauseous, for that reason, she is taking daily Sudafed, allergy pill, herb teas have helped her headache some  UPDATE April 01 2017:YY Laboratory evaluations in January 2019 showed normal CPK, TSH, ESR, ANA, vitamin D, B12, CMP, CBC -reactive protein was mildly elevated at 14.7,  She was seen by behavior health, was started on wellbutrin '400mg'$  daily with diagnosis of depression.  She was treated with lamotrigine, she developed rash.  Previously Topamax '150mg'$  qhs, she felt stuttering, could nto rember her wors.    I have given her a prescription of Lyrica, Maxalt as needed, and Topamax prescription at last visit in January 2019, she did not feel any of the prescription, continue complaints of frequent headaches, seem to correlate with his right ear draining,  I also feel the vocational rehabilitation paperwork for her today,  REVIEW OF SYSTEMS: Full 14 system review of systems performed and notable only for those listed, all others are neg:  Constitutional: neg  Cardiovascular: neg Ear/Nose/Throat: neg  Skin: neg Eyes: neg Respiratory: neg Gastroitestinal: neg  Hematology/Lymphatic: neg  Endocrine: neg Musculoskeletal:neg Allergy/Immunology: neg Neurological: neg Psychiatric: neg Sleep : neg   ALLERGIES: Allergies  Allergen Reactions  . Lamictal [Lamotrigine]     Severe migraine AND rash  . Topiramate Other (See Comments)    Cognitive fog    HOME MEDICATIONS: Outpatient Medications Prior to Visit  Medication Sig Dispense Refill  . buPROPion (WELLBUTRIN XL) 300 MG 24 hr tablet Take 300 mg by mouth daily.    . pregabalin (  LYRICA) 100 MG capsule Take 1 capsule (100 mg total) by mouth at bedtime. 30 capsule 5  . rizatriptan (MAXALT-MLT) 10 MG disintegrating tablet Take 1 tablet (10 mg total) by mouth as  needed. May repeat in 2 hours if needed 15 tablet 6  . venlafaxine (EFFEXOR) 37.5 MG tablet Take 1 tablet (37.5 mg total) by mouth 2 (two) times daily. 60 tablet 11   No facility-administered medications prior to visit.     PAST MEDICAL HISTORY: Past Medical History:  Diagnosis Date  . Hypertension     PAST SURGICAL HISTORY: No past surgical history on file.  FAMILY HISTORY: No family history on file.  SOCIAL HISTORY: Social History   Socioeconomic History  . Marital status: Single    Spouse name: Not on file  . Number of children: Not on file  . Years of education: Not on file  . Highest education level: Not on file  Occupational History  . Not on file  Social Needs  . Financial resource strain: Not on file  . Food insecurity:    Worry: Not on file    Inability: Not on file  . Transportation needs:    Medical: Not on file    Non-medical: Not on file  Tobacco Use  . Smoking status: Never Smoker  . Smokeless tobacco: Never Used  Substance and Sexual Activity  . Alcohol use: No    Frequency: Never  . Drug use: No  . Sexual activity: Not on file  Lifestyle  . Physical activity:    Days per week: Not on file    Minutes per session: Not on file  . Stress: Not on file  Relationships  . Social connections:    Talks on phone: Not on file    Gets together: Not on file    Attends religious service: Not on file    Active member of club or organization: Not on file    Attends meetings of clubs or organizations: Not on file    Relationship status: Not on file  . Intimate partner violence:    Fear of current or ex partner: Not on file    Emotionally abused: Not on file    Physically abused: Not on file    Forced sexual activity: Not on file  Other Topics Concern  . Not on file  Social History Narrative  . Not on file     PHYSICAL EXAM  There were no vitals filed for this visit. There is no height or weight on file to calculate BMI.  Generalized: Well  developed, in no acute distress  Head: normocephalic and atraumatic,. Oropharynx benign  Neck: Supple, no carotid bruits  Cardiac: Regular rate rhythm, no murmur  Musculoskeletal: No deformity   Neurological examination   Mentation: Alert oriented to time, place, history taking. Attention span and concentration appropriate. Recent and remote memory intact.  Follows all commands speech and language fluent.   Cranial nerve II-XII: Fundoscopic exam reveals sharp disc margins.Pupils were equal round reactive to light extraocular movements were full, visual field were full on confrontational test. Facial sensation and strength were normal. hearing was intact to finger rubbing bilaterally. Uvula tongue midline. head turning and shoulder shrug were normal and symmetric.Tongue protrusion into cheek strength was normal. Motor: normal bulk and tone, full strength in the BUE, BLE, fine finger movements normal, no pronator drift. No focal weakness Sensory: normal and symmetric to light touch, pinprick, and  Vibration, proprioception  Coordination: finger-nose-finger, heel-to-shin bilaterally, no dysmetria  Reflexes: Brachioradialis 2/2, biceps 2/2, triceps 2/2, patellar 2/2, Achilles 2/2, plantar responses were flexor bilaterally. Gait and Station: Rising up from seated position without assistance, normal stance,  moderate stride, good arm swing, smooth turning, able to perform tiptoe, and heel walking without difficulty. Tandem gait is steady  DIAGNOSTIC DATA (LABS, IMAGING, TESTING) - I reviewed patient records, labs, notes, testing and imaging myself where available.  Lab Results  Component Value Date   WBC 8.7 02/14/2017   HGB 12.7 02/14/2017   HCT 38.4 02/14/2017   MCV 80 02/14/2017   PLT 167 02/14/2017      Component Value Date/Time   NA 141 02/14/2017 1549   K 4.4 02/14/2017 1549   CL 99 02/14/2017 1549   CO2 26 02/14/2017 1549   GLUCOSE 93 02/14/2017 1549   BUN 9 02/14/2017 1549    CREATININE 0.59 02/14/2017 1549   CALCIUM 9.4 02/14/2017 1549   PROT 7.1 02/14/2017 1549   ALBUMIN 3.9 02/14/2017 1549   AST 25 02/14/2017 1549   ALT 16 02/14/2017 1549   ALKPHOS 77 02/14/2017 1549   BILITOT <0.2 02/14/2017 1549   GFRNONAA 113 02/14/2017 1549   GFRAA 130 02/14/2017 1549   No results found for: CHOL, HDL, LDLCALC, LDLDIRECT, TRIG, CHOLHDL No results found for: HGBA1C Lab Results  Component Value Date   VITAMINB12 255 02/14/2017   Lab Results  Component Value Date   TSH 3.530 02/14/2017    ***  ASSESSMENT AND PLAN  42 y.o. year old female  has a past medical history of Hypertension. here with *** Phinley Schall is a 42 y.o. female   History of pseudotumor cerebri,now has frequent headache with migraine features Fibromyalgia             Topamax 50 mg titrating to 100 mg every night as a preventive medication             Lyrica 100 mg every night             Maxalt 10 mg as needed             MRI of brain, potential lumbar puncture if she still has frequent headaches, fundoscopy examination showed no significant papillary edema                 Dennie Bible, [facility], [facility], APRN  [facility] [address], [place], [place] [address] [phone]

## 2018-01-27 ENCOUNTER — Encounter: Payer: Self-pay | Admitting: Nurse Practitioner

## 2018-01-27 ENCOUNTER — Ambulatory Visit: Payer: Medicare Other | Admitting: Nurse Practitioner

## 2018-04-16 DIAGNOSIS — F329 Major depressive disorder, single episode, unspecified: Secondary | ICD-10-CM | POA: Diagnosis not present

## 2018-04-16 DIAGNOSIS — F902 Attention-deficit hyperactivity disorder, combined type: Secondary | ICD-10-CM | POA: Diagnosis not present

## 2018-04-16 DIAGNOSIS — F4310 Post-traumatic stress disorder, unspecified: Secondary | ICD-10-CM | POA: Diagnosis not present

## 2018-04-16 DIAGNOSIS — F3181 Bipolar II disorder: Secondary | ICD-10-CM | POA: Diagnosis not present

## 2018-08-26 ENCOUNTER — Ambulatory Visit (INDEPENDENT_AMBULATORY_CARE_PROVIDER_SITE_OTHER): Payer: Medicare Other | Admitting: Neurology

## 2018-08-26 ENCOUNTER — Other Ambulatory Visit: Payer: Self-pay

## 2018-08-26 ENCOUNTER — Encounter: Payer: Self-pay | Admitting: Neurology

## 2018-08-26 VITALS — BP 126/76 | HR 93 | Temp 97.9°F | Ht 67.0 in | Wt 268.5 lb

## 2018-08-26 DIAGNOSIS — G43709 Chronic migraine without aura, not intractable, without status migrainosus: Secondary | ICD-10-CM

## 2018-08-26 DIAGNOSIS — F3181 Bipolar II disorder: Secondary | ICD-10-CM | POA: Diagnosis not present

## 2018-08-26 DIAGNOSIS — G932 Benign intracranial hypertension: Secondary | ICD-10-CM | POA: Diagnosis not present

## 2018-08-26 DIAGNOSIS — F902 Attention-deficit hyperactivity disorder, combined type: Secondary | ICD-10-CM | POA: Diagnosis not present

## 2018-08-26 DIAGNOSIS — IMO0002 Reserved for concepts with insufficient information to code with codable children: Secondary | ICD-10-CM

## 2018-08-26 DIAGNOSIS — F329 Major depressive disorder, single episode, unspecified: Secondary | ICD-10-CM | POA: Diagnosis not present

## 2018-08-26 DIAGNOSIS — F4310 Post-traumatic stress disorder, unspecified: Secondary | ICD-10-CM | POA: Diagnosis not present

## 2018-08-26 MED ORDER — NORTRIPTYLINE HCL 25 MG PO CAPS: 50.0000 mg | ORAL_CAPSULE | Freq: Every day | ORAL | 11 refills | Status: DC

## 2018-08-26 MED ORDER — ONDANSETRON 4 MG PO TBDP: 4.0000 mg | ORAL_TABLET | Freq: Three times a day (TID) | ORAL | 6 refills | Status: DC | PRN

## 2018-08-26 MED ORDER — TIZANIDINE HCL 4 MG PO TABS: 4.0000 mg | ORAL_TABLET | Freq: Four times a day (QID) | ORAL | 6 refills | Status: DC | PRN

## 2018-08-26 MED ORDER — PROPRANOLOL HCL 40 MG PO TABS: 40.0000 mg | ORAL_TABLET | Freq: Two times a day (BID) | ORAL | 11 refills | Status: DC

## 2018-08-26 MED ORDER — SUMATRIPTAN SUCCINATE 100 MG PO TABS: 100.0000 mg | ORAL_TABLET | Freq: Once | ORAL | 6 refills | Status: DC | PRN

## 2018-08-26 NOTE — Progress Notes (Signed)
PATIENT: Christine Hanson DOB: Apr 16, 1975  Chief Complaint  Patient presents with  . IH/Migraines    She has noticed an increase in her migraines.  She stopped both venlafaxine and Lyrica but does not remember the reason.  Feels Maxalt is less helpful now and causes excessive drowsiness.     HISTORICAL  Christine Hanson is a 43 year old female, seen in refer by vocational rehabilitation service for evaluation of possible intracranial hypertension, initial evaluation was on February 14, 2017.  She recently moved from [place] to [place] in November 2018, she is here to establish her care, she was diagnosed with intracranial hypertension since 2005, also fibromyalgia, was on disability,  Per patient, who presented with frequent headaches, decreased peripheral visual field, there was CSF leaking from her ears, diagnosis of intracranial hypertension was confirmed by repetitive lumbar puncture, she has multiple hospital admission, repeat lumbar puncture 4-5 times each year initially, she was also treated with Diamox 500 mg daily, eventually was tapered of around 2017, because of her stable symptoms, no significant visual complaints anymore.  She was also treated with Lyrica up to 900 mg daily carried a diagnosis of fibromyalgia, she suffered morbid obesity, is seen by the metabolic clinic, with her diet control, she has 30 weight loss over the past few years.  She now complains of frequent headaches, bilateral frontal, temporal region severe pounding headaches with associated light noise sensitivity, nauseous, for that reason, she is taking daily Sudafed, allergy pill, herb teas have helped her headache some  UPDATE April 01 2017: Laboratory evaluations in January 2019 showed normal CPK, TSH, ESR, ANA, vitamin D, B12, CMP, CBC -reactive protein was mildly elevated at 14.7,  She was seen by behavior health, was started on wellbutrin 436m daily with diagnosis of depression.   She was treated with lamotrigine, she developed rash.  Previously Topamax 1560mqhs, she felt stuttering, could nto rember her wors.    I have given her a prescription of Lyrica, Maxalt as needed, and Topamax prescription at last visit in January 2019, she did not feel any of the prescription, continue complaints of frequent headaches, seem to correlate with his right ear draining,  I also filled the vocational rehabilitation paperwork for her today,  UPDATE August 26 2018: She continue has frequent headaches, weight has been stable, reported normal recent ophthalmology evaluation by Dr. NoHerschel Senegalhowed no evidence of papillary edema, I personally reviewed MRI of brain in April 2019, partial empty sella, no acute intracranial abnormality  She was not able to tolerate Topamax, complains of mental slowing, she has stopped Effexor, it was not helping her headache, she now complains 3-4 headaches each week, holoacranial severe pounding headache with associated light noise sensitivity, extending to her neck, lasting hours to days, she has tried Maxalt used to work well for her, no less effective also cause excessive drowsiness  REVIEW OF SYSTEMS: Full 14 system review of systems performed and notable only for as above  ALLERGIES: Allergies  Allergen Reactions  . Lamictal [Lamotrigine]     Severe migraine AND rash  . Penicillins Swelling  . Tramadol     Other reaction(s): Other Headache and nausea  . Topiramate Other (See Comments)    Cognitive fog    HOME MEDICATIONS: Current Outpatient Medications  Medication Sig Dispense Refill  . buPROPion (WELLBUTRIN XL) 300 MG 24 hr tablet Take 300 mg by mouth daily.    . Marland Kitchenisdexamfetamine (VYVANSE) 20 MG capsule Take 20 mg by mouth daily. States  she will be titrating to 75m daily.    . rizatriptan (MAXALT-MLT) 10 MG disintegrating tablet Take 1 tablet (10 mg total) by mouth as needed. May repeat in 2 hours if needed 15 tablet 6   No current  facility-administered medications for this visit.     PAST MEDICAL HISTORY: Past Medical History:  Diagnosis Date  . Hypertension     PAST SURGICAL HISTORY: History reviewed. No pertinent surgical history.  FAMILY HISTORY: History reviewed. No pertinent family history.  SOCIAL HISTORY:  Social History   Socioeconomic History  . Marital status: Single    Spouse name: Not on file  . Number of children: Not on file  . Years of education: Not on file  . Highest education level: Not on file  Occupational History  . Not on file  Social Needs  . Financial resource strain: Not on file  . Food insecurity    Worry: Not on file    Inability: Not on file  . Transportation needs    Medical: Not on file    Non-medical: Not on file  Tobacco Use  . Smoking status: Never Smoker  . Smokeless tobacco: Never Used  Substance and Sexual Activity  . Alcohol use: No    Frequency: Never  . Drug use: No  . Sexual activity: Not on file  Lifestyle  . Physical activity    Days per week: Not on file    Minutes per session: Not on file  . Stress: Not on file  Relationships  . Social [place] phone: Not on file    Gets together: Not on file    Attends religious service: Not on file    Active member of club or organization: Not on file    Attends meetings of clubs or organizations: Not on file    Relationship status: Not on file  . Intimate partner violence    Fear of current or ex partner: Not on file    Emotionally abused: Not on file    Physically abused: Not on file    Forced sexual activity: Not on file  Other Topics Concern  . Not on file  Social History Narrative  . Not on file     PHYSICAL EXAM   Vitals:   08/26/18 1007  BP: 126/76  Pulse: 93  Temp: 97.9 F (36.6 C)  Weight: 268 lb 8 oz (121.8 kg)  Height: _0  (1.702 m)    Not recorded      Body mass index is 42.05 kg/m.  PHYSICAL EXAMNIATION:  Gen: NAD, conversant, well nourised, obese,  well groomed                     Cardiovascular: Regular rate rhythm, no peripheral edema, warm, nontender. Eyes: Conjunctivae clear without exudates or hemorrhage Neck: Supple, no carotid bruits. Pulmonary: Clear to auscultation bilaterally   NEUROLOGICAL EXAM:  MENTAL STATUS: Speech:    Speech is normal; fluent and spontaneous with normal comprehension.  Cognition:     Orientation to time, place and person     Normal recent and remote memory     Normal Attention span and concentration     Normal Language, naming, repeating,spontaneous speech     Fund of knowledge   CRANIAL NERVES: CN II: Visual fields are full to confrontation.   Pupils are round equal and briskly reactive to light. CN III, IV, VI: extraocular movement are normal. No ptosis. CN V: Facial sensation  is intact to pinprick in all 3 divisions bilaterally. Corneal responses are intact.  CN VII: Face is symmetric with normal eye closure and smile. CN VIII: Right ear external auditory canal oily cerumen,  CN IX, X: Palate elevates symmetrically. Phonation is normal. CN XI: Head turning and shoulder shrug are intact CN XII: Tongue is midline with normal movements and no atrophy.  MOTOR: There is no pronator drift of out-stretched arms. Muscle bulk and tone are normal. Muscle strength is normal.  REFLEXES: Reflexes are 2+ and symmetric at the biceps, triceps, knees, and ankles. Plantar responses are flexor.  SENSORY: Intact to light touch, pinprick, positional sensation and vibratory sensation are intact in fingers and toes.  COORDINATION: Rapid alternating movements and fine finger movements are intact. There is no dysmetria on finger-to-nose and heel-knee-shin.    GAIT/STANCE: Posture is normal. Gait is steady with normal steps, base, arm swing, and turning. Heel and toe walking are normal. Tandem gait is normal.  Romberg is absent.   DIAGNOSTIC DATA (LABS, IMAGING, TESTING) - I reviewed patient records, labs,  notes, testing and imaging myself where available.   ASSESSMENT AND PLAN  Christine Hanson is a 43 y.o. female   History of pseudotumor cerebri,now has frequent headache with migraine features Fibromyalgia  Could not tolerate max due to side effect of mental slowing, Effexor, not effective for headaches,   Propanolol 40 mg twice a day, nortriptyline titrating to 50 mg every night has migraine prevention  Maxalt caused excessive drowsiness, less effective for headaches,  Will try Imitrex 100 mg as needed, may mix together with Aleve, Zofran, tizanidine as needed  Marcial Pacas, M.D. Ph.D.  [facility] [address] [place], [place] [address] Ph: [phone] Fax: [phone]  CC:

## 2018-08-26 NOTE — Patient Instructions (Signed)
Migraine preventive medication: Nortriptyline 25 mg, titrating to 50 mg every night Propanolol 40 mg twice a day  Migraine abortive medications Imitrex as needed May mix with Aleve 1 to 2 tablets as needed Zofran for nausea Tizanidine for muscle relaxant

## 2018-08-27 DIAGNOSIS — Z0289 Encounter for other administrative examinations: Secondary | ICD-10-CM

## 2018-08-28 ENCOUNTER — Telehealth: Payer: Self-pay | Admitting: *Deleted

## 2018-08-28 NOTE — Telephone Encounter (Signed)
Pt [name] form faxed on 08/28/18

## 2018-09-10 DIAGNOSIS — F329 Major depressive disorder, single episode, unspecified: Secondary | ICD-10-CM | POA: Diagnosis not present

## 2018-10-01 DIAGNOSIS — F902 Attention-deficit hyperactivity disorder, combined type: Secondary | ICD-10-CM | POA: Diagnosis not present

## 2018-10-01 DIAGNOSIS — F329 Major depressive disorder, single episode, unspecified: Secondary | ICD-10-CM | POA: Diagnosis not present

## 2018-10-08 ENCOUNTER — Telehealth: Payer: Self-pay | Admitting: Neurology

## 2018-10-08 NOTE — Telephone Encounter (Signed)
Pt called stating that her Migraines are still really bad and due to the pain she has missed a lot of work and she was informed at work that she is to speak to her provider again due to all the time she is missing and her FMLA. Pt sates that if you reach the VM you can leave a message. Please advise.

## 2018-10-08 NOTE — Telephone Encounter (Signed)
The patient reports that after her last therapy change, she had to step away from her computer screen more frequently due to continued headaches.  She thinks it is because the medication had not started working well yet.  Each time she steps away from her screen is considered time away from her job.  States she is doing better with tolerating her computer now and is requiring less breaks.  However, she has exhausted her FMLA time for the last month.  Her employer is asking her to return to the doctor and obtain a note.  States the note will need to address the additional time away from her job was related to her medication change and should not be a problem moving forward.  She works 10-12 hour days.  Her current FMLA is on file for 1-3 days each month which equates to 30-36 hours, depending on her shift.  I informed her it was not reasonable to allow more time and in doing so could result in termination of her job.  She is in agreement and would just like the letter explaining why she went over hours recently.   She is concerned about still having frequent headaches and would also like to discuss this issue at her appt.  She was schedule with Judson Roch in October but this appt has been moved to 10/13/2018.

## 2018-10-12 NOTE — Progress Notes (Signed)
Virtual Visit via Video Note  I connected with [name] on 10/12/18 at 12:45 PM EDT by a video enabled telemedicine application and verified that I am speaking with the correct person using two identifiers.  Location: Patient: In the car Provider: In the office   I discussed the limitations of evaluation and management by telemedicine and the availability of in person appointments. The patient expressed understanding and agreed to proceed.  History of Present Illness: Christine Hanson is a 43 year old female, seen in refer by vocational rehabilitation service for evaluation of possible intracranial hypertension, initial evaluation was on February 14, 2017.  She recently moved from [place] to [place] in November 2018, she is here to establish her care, she was diagnosed with intracranial hypertension since 2005, also fibromyalgia, was on disability,  Per patient, who presented with frequent headaches, decreased peripheral visual field, there was CSF leaking from her ears, diagnosis of intracranial hypertension was confirmed by repetitive lumbar puncture, she has multiple hospital admission, repeat lumbar puncture 4-5 times each year initially, she was also treated with Diamox 500 mg daily, eventually was tapered of around 2017, because of her stable symptoms, no significant visual complaints anymore.  She was also treated with Lyrica up to 900 mg daily carried a diagnosis of fibromyalgia, she suffered morbid obesity, is seen by the metabolic clinic, with her diet control, she has 30 weight loss over the past few years.  She now complains of frequent headaches, bilateral frontal, temporal region severe pounding headaches with associated light noise sensitivity, nauseous, for that reason, she is taking daily Sudafed, allergy pill, herb teas have helped her headache some  UPDATE April 01 2017: Laboratory evaluations in January 2019 showed normal CPK, TSH, ESR,  ANA, vitamin D, B12, CMP, CBC -reactive protein was mildly elevated at 14.7,  She was seen by behavior health, was started on wellbutrin 435m daily with diagnosis of depression.  She was treated with lamotrigine, she developed rash.  Previously Topamax 1553mqhs, she felt stuttering, could nto rember her wors.    I have given her a prescription of Lyrica, Maxalt as needed, and Topamax prescription at last visit in January 2019, she did not feel any of the prescription, continue complaints of frequent headaches, seem to correlate with his right ear draining,  I also filled the vocational rehabilitation paperwork for her today,  UPDATE August 26 2018: She continue has frequent headaches, weight has been stable, reported normal recent ophthalmology evaluation by Dr. NoHerschel Senegalhowed no evidence of papillary edema, I personally reviewed MRI of brain in April 2019, partial empty sella, no acute intracranial abnormality  She was not able to tolerate Topamax, complains of mental slowing, she has stopped Effexor, it was not helping her headache, she now complains 3-4 headaches each week, holoacranial severe pounding headache with associated light noise sensitivity, extending to her neck, lasting hours to days, she has tried Maxalt used to work well for her, no less effective also cause excessive drowsiness  Update October 13, 2018 SS: Virtual visit, she says she has intracranial HTN, and migraines. No other symptoms associated with intracranial HTN. She got new glasses, that has helped with her headaches. She is under stress with work and family. Last 1 month, her work has said she exceeded her FMLA. Her job is on the computer and on the phone. Hasn't been taking propranolol. The nortriptyline has been helpful, is sleeping better. She is having 3 migraines a week, they are less intense. Imitrex  does not fully relieve her headache. Her typical headache, wakes up in the morning once a week, the others feel  pressure in her head. She doesn't have visual disturbance. She saw eye doctor 3 weeks, Dr. Tamala Julian at [facility] care, no papilledema  Observations/Objective: Is alert and oriented, answers questions appropriately, speech is clear and concise, facial symmetry noted, symmetric eye movements, no arm drift, in car unable to observe gait  Assessment and Plan:  1.  History of pseudotumor cerebri 2.  Migraine headache -She reports recent eye exam was negative for papilledema, denies visual disturbance -She has been unable to tolerate Topamax or Effexor in the past -She has had some improvement in her migraine headaches since last visit, as result of nortriptyline, sleeping better, new glasses -She is now having on average 3 migraines a week, taking Imitrex -Continue nortriptyline 50 mg at bedtime -Start taking propanolol 40 mg twice daily (has been prescribed this, but not taking) -Continue Imitrex as needed, may mix with Aleve, Zofran, tizanidine -She has FMLA for acute migraine headaches, 2-3 times, 1 day per episode, that would be 30 hours a month since she works 10 hour shifts, her work is saying she exceeded her hours last month, is asking for a note stating occurring as result of medication adjustment, I will dictate a note. -If headaches not better in 2-3 weeks, still missing more time off work, she is to contact our office, May consider CGRP in the future or even Zonegran -She will follow-up in 3 months or sooner if needed  Follow Up Instructions: 3 months 01/13/2019 8:45am   I discussed the assessment and treatment plan with the patient. The patient was provided an opportunity to ask questions and all were answered. The patient agreed with the plan and demonstrated an understanding of the instructions.   The patient was advised to call back or seek an in-person evaluation if the symptoms worsen or if the condition fails to improve as anticipated.  I provided 15 minutes of non-face-to-face  time during this encounter.  Evangeline Dakin, DNP  [facility] [address], [place], [place] [address] [phone]

## 2018-10-13 ENCOUNTER — Telehealth (INDEPENDENT_AMBULATORY_CARE_PROVIDER_SITE_OTHER): Payer: BC Managed Care – PPO | Admitting: Neurology

## 2018-10-13 ENCOUNTER — Encounter: Payer: Self-pay | Admitting: *Deleted

## 2018-10-13 ENCOUNTER — Encounter: Payer: Self-pay | Admitting: Neurology

## 2018-10-13 DIAGNOSIS — G43709 Chronic migraine without aura, not intractable, without status migrainosus: Secondary | ICD-10-CM

## 2018-10-13 DIAGNOSIS — IMO0002 Reserved for concepts with insufficient information to code with codable children: Secondary | ICD-10-CM

## 2018-10-13 NOTE — Progress Notes (Signed)
I have reviewed and agreed above plan. 

## 2018-10-13 NOTE — Progress Notes (Signed)
Letter faxed to [place] to attach to [facility] 08-27-18.  Fax confirmation received. [phone].

## 2018-10-14 DIAGNOSIS — F329 Major depressive disorder, single episode, unspecified: Secondary | ICD-10-CM | POA: Diagnosis not present

## 2018-10-16 ENCOUNTER — Telehealth: Payer: Self-pay | Admitting: Neurology

## 2018-10-16 NOTE — Telephone Encounter (Signed)
I attempted to reach her by phone but had to leave a message.  She has been on this medication for a long time so we need to get more information from her.    When the patient calls back, please see if Lovey Newcomer can help with this call. The patient was just seen by Butler Denmark, NP on 10/13/2018.

## 2018-10-16 NOTE — Telephone Encounter (Signed)
Pt called stating that she is getting nauseous on the propranolol (INDERAL) 40 MG tablet and it is not helping with the headaches. Please advise.

## 2018-10-20 DIAGNOSIS — F902 Attention-deficit hyperactivity disorder, combined type: Secondary | ICD-10-CM | POA: Diagnosis not present

## 2018-10-20 DIAGNOSIS — F3181 Bipolar II disorder: Secondary | ICD-10-CM | POA: Diagnosis not present

## 2018-10-20 DIAGNOSIS — F4310 Post-traumatic stress disorder, unspecified: Secondary | ICD-10-CM | POA: Diagnosis not present

## 2018-10-20 DIAGNOSIS — F329 Major depressive disorder, single episode, unspecified: Secondary | ICD-10-CM | POA: Diagnosis not present

## 2018-10-20 NOTE — Telephone Encounter (Signed)
LMVM for pt to return call re: to nausea from propranolol and this not helping her migraines either.

## 2018-10-28 DIAGNOSIS — F3181 Bipolar II disorder: Secondary | ICD-10-CM | POA: Diagnosis not present

## 2018-10-28 DIAGNOSIS — F4310 Post-traumatic stress disorder, unspecified: Secondary | ICD-10-CM | POA: Diagnosis not present

## 2018-10-28 DIAGNOSIS — F902 Attention-deficit hyperactivity disorder, combined type: Secondary | ICD-10-CM | POA: Diagnosis not present

## 2018-10-28 DIAGNOSIS — F411 Generalized anxiety disorder: Secondary | ICD-10-CM | POA: Diagnosis not present

## 2018-10-28 DIAGNOSIS — F329 Major depressive disorder, single episode, unspecified: Secondary | ICD-10-CM | POA: Diagnosis not present

## 2018-11-25 DIAGNOSIS — F902 Attention-deficit hyperactivity disorder, combined type: Secondary | ICD-10-CM | POA: Diagnosis not present

## 2018-11-25 DIAGNOSIS — F411 Generalized anxiety disorder: Secondary | ICD-10-CM | POA: Diagnosis not present

## 2018-11-26 ENCOUNTER — Ambulatory Visit: Payer: Medicare Other | Admitting: Neurology

## 2018-12-08 DIAGNOSIS — F3181 Bipolar II disorder: Secondary | ICD-10-CM | POA: Diagnosis not present

## 2018-12-08 DIAGNOSIS — F329 Major depressive disorder, single episode, unspecified: Secondary | ICD-10-CM | POA: Diagnosis not present

## 2018-12-08 DIAGNOSIS — F902 Attention-deficit hyperactivity disorder, combined type: Secondary | ICD-10-CM | POA: Diagnosis not present

## 2018-12-08 DIAGNOSIS — F4310 Post-traumatic stress disorder, unspecified: Secondary | ICD-10-CM | POA: Diagnosis not present

## 2018-12-08 DIAGNOSIS — F411 Generalized anxiety disorder: Secondary | ICD-10-CM | POA: Diagnosis not present

## 2018-12-31 DIAGNOSIS — J4521 Mild intermittent asthma with (acute) exacerbation: Secondary | ICD-10-CM | POA: Insufficient documentation

## 2019-01-01 DIAGNOSIS — F329 Major depressive disorder, single episode, unspecified: Secondary | ICD-10-CM | POA: Diagnosis not present

## 2019-01-02 ENCOUNTER — Telehealth: Payer: Self-pay | Admitting: Neurology

## 2019-01-02 NOTE — Telephone Encounter (Signed)
Desiree from [place] is calling in wanting to know what med would be ok for pt to take for her pneumonia due to her allergies and her hypertension  CB# [phone]

## 2019-01-02 NOTE — Telephone Encounter (Addendum)
I returned the call to [place], [place] at PCP office.  Reports starting the patient on Levaquin.  She felt like it would be the best choice given her diagnosis of intracranial hypertension.  Also, the patient has done well on this antibiotic in the past.  She is also allergic to PCN which limited her options.  She wanted to make our office aware of her medical decision in case the patient develops an increase in headaches.  She has discussed this plan with the patient.  States this was more of an informative call to involve [place] in the patient's care.

## 2019-01-02 NOTE — Telephone Encounter (Signed)
Sade: Please call Christine Hanson back and advise her that I cannot make any suggestions for her pneumonia treatment. Please check drug-drug interaction with online tool or with patient's pharmacy. I will copy Dr. Krista Blue and her nurse, if any additional recommendations are made by Dr. Krista Blue, she will update after reviewing on Monday.

## 2019-01-13 ENCOUNTER — Encounter: Payer: Self-pay | Admitting: Neurology

## 2019-01-13 ENCOUNTER — Telehealth: Payer: Self-pay

## 2019-01-13 ENCOUNTER — Ambulatory Visit: Payer: Medicare Other | Admitting: Neurology

## 2019-01-13 NOTE — Telephone Encounter (Signed)
Patient was a no call/no show for their appointment today.   

## 2019-01-13 NOTE — Progress Notes (Deleted)
PATIENT: Christine Hanson DOB: 08/19/1975  REASON FOR VISIT: follow up HISTORY FROM: patient  HISTORY OF PRESENT ILLNESS: Today 01/13/19  HISTORY Christine Hanson a 43 year old female, seen in refer by vocational rehabilitation service for evaluation of possible intracranial hypertension, initial evaluation was on February 14, 2017.  She recently moved from [place] to [place] in November 2018, she is here to establish her care, she was diagnosed with intracranial hypertension since 2005, also fibromyalgia, was on disability,  Per patient, who presented with frequent headaches, decreased peripheral visual field, there was CSF leaking from her ears, diagnosis of intracranial hypertension was confirmed by repetitive lumbar puncture, she has multiple hospital admission, repeat lumbar puncture 4-5 times each year initially, she was also treated with Diamox 500 mg daily, eventually was tapered of around 2017, because of her stable symptoms, no significant visual complaints anymore.  She was also treated with Lyrica up to 900 mg daily carried a diagnosis of fibromyalgia, she suffered morbid obesity, is seen by the metabolic clinic, with her diet control, she has 30 weight loss over the past few years.  She now complains of frequent headaches, bilateral frontal, temporal region severe pounding headaches with associated light noise sensitivity, nauseous, for that reason, she is taking daily Sudafed, allergy pill, herb teas have helped her headache some  UPDATE April 01 2017: Laboratory evaluations in January 2019 showed normal CPK, TSH, ESR, ANA, vitamin D, B12, CMP, CBC -reactive protein was mildly elevated at 14.7,  She was seen by behavior health, was started on wellbutrin 477m daily with diagnosis of depression. She was treated with lamotrigine, she developed rash. Previously Topamax 1511mqhs, she felt stuttering, could nto rember her wors.   I have given her a  prescription of Lyrica, Maxalt as needed, and Topamax prescription at last visit in January 2019, she did not feel any of the prescription, continue complaints of frequent headaches, seem to correlate with his right ear draining,  I also filledthe vocational rehabilitation paperwork for her today,  UPDATE August 26 2018: She continue has frequent headaches, weight has been stable, reported normal recent ophthalmology evaluation by Dr. NoHerschel Senegalhowed no evidence of papillary edema, I personally reviewed MRI of brain in April 2019, partial empty sella, no acute intracranial abnormality  She was not able to tolerate Topamax, complains of mental slowing, she has stopped Effexor, it was not helping her headache, she now complains 3-4 headaches each week, holoacranial severe pounding headache with associated light noise sensitivity, extending to her neck, lasting hours to days, she has tried Maxalt used to work well for her, no less effective also cause excessive drowsiness  Update October 13, 2018 SS: Virtual visit, she says she has intracranial HTN, and migraines. No other symptoms associated with intracranial HTN. She got new glasses, that has helped with her headaches. She is under stress with work and family. Last 1 month, her work has said she exceeded her FMLA. Her job is on the computer and on the phone. Hasn't been taking propranolol. The nortriptyline has been helpful, is sleeping better. She is having 3 migraines a week, they are less intense. Imitrex does not fully relieve her headache. Her typical headache, wakes up in the morning once a week, the others feel pressure in her head. She doesn't have visual disturbance. She [facility] weeks[facility], no [facility]illed[facility]   Update January 13, 2019 SS:   REVIEW OF SYSTEMS: Out of a complete 14  system review of symptoms, the patient complains only of the following symptoms, and all other reviewed systems are negative.   ALLERGIES: Allergies  Allergen Reactions  . Lamictal [Lamotrigine]     Severe migraine AND rash  . Penicillins Swelling  . Tramadol     Other reaction(s): Other Headache and nausea  . Topiramate Other (See Comments)    Cognitive fog    HOME MEDICATIONS: Outpatient Medications Prior to Visit  Medication Sig Dispense Refill  . buPROPion (WELLBUTRIN XL) 300 MG 24 hr tablet Take 300 mg by mouth daily.    Marland Kitchen lisdexamfetamine (VYVANSE) 20 MG capsule Take 20 mg by mouth daily. States she will be titrating to 42m daily.    . nortriptyline (PAMELOR) 25 MG capsule Take 2 capsules (50 mg total) by mouth at bedtime. 60 capsule 11  . ondansetron (ZOFRAN ODT) 4 MG disintegrating tablet Take 1 tablet (4 mg total) by mouth every 8 (eight) hours as needed. 20 tablet 6  . propranolol (INDERAL) 40 MG tablet Take 1 tablet (40 mg total) by mouth 2 (two) times daily. 60 tablet 11  . SUMAtriptan (IMITREX) 100 MG tablet Take 1 tablet (100 mg total) by mouth once as needed for up to 1 dose for migraine. May repeat in 2 hours if headache persists or recurs. 12 tablet 6  . tiZANidine (ZANAFLEX) 4 MG tablet Take 1 tablet (4 mg total) by mouth every 6 (six) hours as needed for muscle spasms. 30 tablet 6   No facility-administered medications prior to visit.    PAST MEDICAL HISTORY: Past Medical History:  Diagnosis Date  . Hypertension     PAST SURGICAL HISTORY: No past surgical history on file.  FAMILY HISTORY: No family history on file.  SOCIAL HISTORY: Social History   Socioeconomic History  . Marital status: Single    Spouse name: Not on file  . Number of children: Not on file  . Years of education: Not on file  . Highest education level: Not on file  Occupational History  . Not on file  Tobacco Use  . Smoking status: Never Smoker  . Smokeless tobacco: Never Used  Substance and Sexual Activity  . Alcohol use: No  . Drug use: No  . Sexual activity: Not on file  Other Topics Concern  .  Not on file  Social History Narrative  . Not on file   Social Determinants of Health   Financial Resource Strain:   . Difficulty of Paying Living Expenses: Not on file  Food Insecurity:   . Worried About RCharity fundraiserin the Last Year: Not on file  . Ran Out of Food in the Last Year: Not on file  Transportation Needs:   . Lack of Transportation (Medical): Not on file  . Lack of Transportation (Non-Medical): Not on file  Physical Activity:   . Days of Exercise per Week: Not on file  . Minutes of Exercise per Session: Not on file  Stress:   . Feeling of Stress : Not on file  Social Connections:   . Frequency of Communication with Friends and Family: Not on file  . Frequency of Social Gatherings with Friends and Family: Not on file  . Attends Religious Services: Not on file  . Active Member of Clubs or Organizations: Not on file  . Attends CArchivistMeetings: Not on file  . Marital Status: Not on file  Intimate Partner Violence:   . Fear of Current or Ex-Partner: Not on  file  . Emotionally Abused: Not on file  . Physically Abused: Not on file  . Sexually Abused: Not on file      PHYSICAL EXAM  There were no vitals filed for this visit. There is no height or weight on file to calculate BMI.  Generalized: Well developed, in no acute distress   Neurological examination  Mentation: Alert oriented to time, place, history taking. Follows all commands speech and language fluent Cranial nerve II-XII: Pupils were equal round reactive to light. Extraocular movements were full, visual field were full on confrontational test. Facial sensation and strength were normal. Uvula tongue midline. Head turning and shoulder shrug  were normal and symmetric. Motor: The motor testing reveals 5 over 5 strength of all 4 extremities. Good symmetric motor tone is noted throughout.  Sensory: Sensory testing is intact to soft touch on all 4 extremities. No evidence of extinction is  noted.  Coordination: Cerebellar testing reveals good finger-nose-finger and heel-to-shin bilaterally.  Gait and station: Gait is normal. Tandem gait is normal. Romberg is negative. No drift is seen.  Reflexes: Deep tendon reflexes are symmetric and normal bilaterally.   DIAGNOSTIC DATA (LABS, IMAGING, TESTING) - I reviewed patient records, labs, notes, testing and imaging myself where available.  Lab Results  Component Value Date   WBC 8.7 02/14/2017   HGB 12.7 02/14/2017   HCT 38.4 02/14/2017   MCV 80 02/14/2017   PLT 167 02/14/2017      Component Value Date/Time   NA 141 02/14/2017 1549   K 4.4 02/14/2017 1549   CL 99 02/14/2017 1549   CO2 26 02/14/2017 1549   GLUCOSE 93 02/14/2017 1549   BUN 9 02/14/2017 1549   CREATININE 0.59 02/14/2017 1549   CALCIUM 9.4 02/14/2017 1549   PROT 7.1 02/14/2017 1549   ALBUMIN 3.9 02/14/2017 1549   AST 25 02/14/2017 1549   ALT 16 02/14/2017 1549   ALKPHOS 77 02/14/2017 1549   BILITOT <0.2 02/14/2017 1549   GFRNONAA 113 02/14/2017 1549   GFRAA 130 02/14/2017 1549   No results found for: CHOL, HDL, LDLCALC, LDLDIRECT, TRIG, CHOLHDL No results found for: HGBA1C Lab Results  Component Value Date   VITAMINB12 255 02/14/2017   Lab Results  Component Value Date   TSH 3.530 02/14/2017      ASSESSMENT AND PLAN 43 y.o. year old female  has a past medical history of Hypertension. here with ***   I spent 15 minutes with the patient. 50% of this time was spent   Butler Denmark, [place], DNP 01/13/2019, 5:32 AM [facility] [address], [place], [place] [address] [phone]

## 2019-02-02 DIAGNOSIS — F3181 Bipolar II disorder: Secondary | ICD-10-CM | POA: Diagnosis not present

## 2019-02-02 DIAGNOSIS — F4310 Post-traumatic stress disorder, unspecified: Secondary | ICD-10-CM | POA: Diagnosis not present

## 2019-02-02 DIAGNOSIS — F411 Generalized anxiety disorder: Secondary | ICD-10-CM | POA: Diagnosis not present

## 2019-02-02 DIAGNOSIS — F329 Major depressive disorder, single episode, unspecified: Secondary | ICD-10-CM | POA: Diagnosis not present

## 2019-02-02 DIAGNOSIS — F902 Attention-deficit hyperactivity disorder, combined type: Secondary | ICD-10-CM | POA: Diagnosis not present

## 2019-03-12 ENCOUNTER — Ambulatory Visit: Payer: Medicare Other | Admitting: Allergy & Immunology

## 2019-03-20 DIAGNOSIS — R030 Elevated blood-pressure reading, without diagnosis of hypertension: Secondary | ICD-10-CM | POA: Insufficient documentation

## 2019-04-01 ENCOUNTER — Telehealth: Payer: Self-pay | Admitting: Neurology

## 2019-04-01 NOTE — Telephone Encounter (Signed)
FYI pt has called to schedule a f/u for tomorrow due to headaches not getting any better.  Pt will check in at 8:15

## 2019-04-01 NOTE — Telephone Encounter (Signed)
Noted! Thank you

## 2019-04-02 ENCOUNTER — Ambulatory Visit (INDEPENDENT_AMBULATORY_CARE_PROVIDER_SITE_OTHER): Payer: BC Managed Care – PPO | Admitting: Neurology

## 2019-04-02 ENCOUNTER — Other Ambulatory Visit: Payer: Self-pay

## 2019-04-02 ENCOUNTER — Encounter: Payer: Self-pay | Admitting: Neurology

## 2019-04-02 VITALS — BP 142/85 | HR 99 | Temp 98.0°F | Ht 67.0 in | Wt 280.2 lb

## 2019-04-02 DIAGNOSIS — G932 Benign intracranial hypertension: Secondary | ICD-10-CM | POA: Diagnosis not present

## 2019-04-02 DIAGNOSIS — IMO0002 Reserved for concepts with insufficient information to code with codable children: Secondary | ICD-10-CM

## 2019-04-02 DIAGNOSIS — G43709 Chronic migraine without aura, not intractable, without status migrainosus: Secondary | ICD-10-CM | POA: Diagnosis not present

## 2019-04-02 MED ORDER — PROPRANOLOL HCL 40 MG PO TABS: 40.0000 mg | ORAL_TABLET | Freq: Two times a day (BID) | ORAL | 11 refills | Status: DC

## 2019-04-02 MED ORDER — AJOVY 225 MG/1.5ML ~~LOC~~ SOAJ: 225.0000 mg | SUBCUTANEOUS | 11 refills | Status: DC

## 2019-04-02 NOTE — Progress Notes (Signed)
PATIENT: Christine Hanson DOB: 1975-08-08  REASON FOR VISIT: follow up HISTORY FROM: patient  HISTORY OF PRESENT ILLNESS: Today 04/02/19  HISTORY  Christine Hanson a 44 year old female, seen in refer by vocational rehabilitation service for evaluation of possible intracranial hypertension, initial evaluation was on February 14, 2017.  She recently moved from [place] to [place] in November 2018, she is here to establish her care, she was diagnosed with intracranial hypertension since 2005, also fibromyalgia, was on disability,  Per patient, who presented with frequent headaches, decreased peripheral visual field, there was CSF leaking from her ears, diagnosis of intracranial hypertension was confirmed by repetitive lumbar puncture, she has multiple hospital admission, repeat lumbar puncture 4-5 times each year initially, she was also treated with Diamox 500 mg daily, eventually was tapered of around 2017, because of her stable symptoms, no significant visual complaints anymore.  She was also treated with Lyrica up to 900 mg daily carried a diagnosis of fibromyalgia, she suffered morbid obesity, is seen by the metabolic clinic, with her diet control, she has 30 weight loss over the past few years.  She now complains of frequent headaches, bilateral frontal, temporal region severe pounding headaches with associated light noise sensitivity, nauseous, for that reason, she is taking daily Sudafed, allergy pill, herb teas have helped her headache some  UPDATE April 01 2017: Laboratory evaluations in January 2019 showed normal CPK, TSH, ESR, ANA, vitamin D, B12, CMP, CBC -reactive protein was mildly elevated at 14.7,  She was seen by behavior health, was started on wellbutrin 458m daily with diagnosis of depression. She was treated with lamotrigine, she developed rash. Previously Topamax [id number], she felt stuttering, could nto rember her wors.   I have given her  a prescription of Lyrica, Maxalt as needed, and Topamax prescription at last visit in January 2019, she did not feel any of the prescription, continue complaints of frequent headaches, seem to correlate with his right ear draining,  I also filledthe vocational rehabilitation paperwork for her today,  UPDATE August 26 2018: She continue has frequent headaches, weight has been stable, reported normal recent ophthalmology evaluation by Dr. NoHerschel Senegalhowed no evidence of papillary edema, I personally reviewed MRI of brain in April 2019, partial empty sella, no acute intracranial abnormality  She was not able to tolerate Topamax, complains of mental slowing, she has stopped Effexor, it was not helping her headache, she now complains 3-4 headaches each week, holoacranial severe pounding headache with associated light noise sensitivity, extending to her neck, lasting hours to days, she has tried Maxalt used to work well for her, no less effective also cause excessive drowsiness  Update October 13, 2018 SS: Virtual visit, she says she has intracranial HTN, and migraines. No other symptoms associated with intracranial HTN. She got new glasses, that has helped with her headaches. She is under stress with work and family. Last 1 month, her work has said she exceeded her FMLA. Her job is on the computer and on the phone. Hasn't been taking propranolol. The nortriptyline has been helpful, is sleeping better. She is having 3 migraines a week, they are less intense. Imitrex does not fully relieve her headache. Her typical headache, wakes up in the morning once a week, the others feel pressure in her head. She doesn't have visual disturbance. She [facility] 3 weeks[facility], no [facility]illedema  Update 04/02/2019 SS: She is no longer taking propanolol, she never started nortriptyline.  She  complains of daily headache, about 3 significant headaches a week.  The headaches are in the occipital area,  bifrontally, with significant headache, photophobia, nausea.  When the headache is significant, will be stabbing pain, usually just dull ache.  She has history of ICH, but says these headaches are different, currently no visual disturbance, or narrowing of vision, or gait instability, with ICH headaches, feels her head is too heavy for her neck.  She has been having some drainage from her ears, eyes and nose, but is having allergy testing tomorrow, thinks more related to allergies?  She does not want LP. Does mention if she may need sleep study?  REVIEW OF SYSTEMS: Out of a complete 14 system review of symptoms, the patient complains only of the following symptoms, and all other reviewed systems are negative.  Headache  ALLERGIES: Allergies  Allergen Reactions  . Lamictal [Lamotrigine]     Severe migraine AND rash  . Penicillins Swelling  . Tramadol     Other reaction(s): Other Headache and nausea  . Topiramate Other (See Comments)    Cognitive fog    HOME MEDICATIONS: Outpatient Medications Prior to Visit  Medication Sig Dispense Refill  . buPROPion (WELLBUTRIN XL) 300 MG 24 hr tablet Take 300 mg by mouth daily.    Marland Kitchen lisdexamfetamine (VYVANSE) 20 MG capsule Take 20 mg by mouth daily. States she will be titrating to 58m daily.    . nortriptyline (PAMELOR) 25 MG capsule Take 2 capsules (50 mg total) by mouth at bedtime. 60 capsule 11  . ondansetron (ZOFRAN ODT) 4 MG disintegrating tablet Take 1 tablet (4 mg total) by mouth every 8 (eight) hours as needed. 20 tablet 6  . SUMAtriptan (IMITREX) 100 MG tablet Take 1 tablet (100 mg total) by mouth once as needed for up to 1 dose for migraine. May repeat in 2 hours if headache persists or recurs. 12 tablet 6  . tiZANidine (ZANAFLEX) 4 MG tablet Take 1 tablet (4 mg total) by mouth every 6 (six) hours as needed for muscle spasms. 30 tablet 6  . propranolol (INDERAL) 40 MG tablet Take 1 tablet (40 mg total) by mouth 2 (two) times daily. 60 tablet  11   No facility-administered medications prior to visit.    PAST MEDICAL HISTORY: Past Medical History:  Diagnosis Date  . Hypertension     PAST SURGICAL HISTORY: History reviewed. No pertinent surgical history.  FAMILY HISTORY: History reviewed. No pertinent family history.  SOCIAL HISTORY: Social History   Socioeconomic History  . Marital status: Significant Other    Spouse name: Not on file  . Number of children: Not on file  . Years of education: Not on file  . Highest education level: Not on file  Occupational History  . Not on file  Tobacco Use  . Smoking status: Never Smoker  . Smokeless tobacco: Never Used  Substance and Sexual Activity  . Alcohol use: No  . Drug use: No  . Sexual activity: Not on file  Other Topics Concern  . Not on file  Social History Narrative  . Not on file   Social Determinants of Health   Financial Resource Strain:   . Difficulty of Paying Living Expenses: Not on file  Food Insecurity:   . Worried About RCharity fundraiserin the Last Year: Not on file  . Ran Out of Food in the Last Year: Not on file  Transportation Needs:   . Lack of Transportation (Medical): Not on file  .  Lack of Transportation (Non-Medical): Not on file  Physical Activity:   . Days of Exercise per Week: Not on file  . Minutes of Exercise per Session: Not on file  Stress:   . Feeling of Stress : Not on file  Social Connections:   . Frequency of Communication with Friends and Family: Not on file  . Frequency of Social Gatherings with Friends and Family: Not on file  . Attends Religious Services: Not on file  . Active Member of Clubs or Organizations: Not on file  . Attends Archivist Meetings: Not on file  . Marital Status: Not on file  Intimate Partner Violence:   . Fear of Current or Ex-Partner: Not on file  . Emotionally Abused: Not on file  . Physically Abused: Not on file  . Sexually Abused: Not on file   PHYSICAL EXAM  Vitals:    04/02/19 1008  BP: (!) 142/85  Pulse: 99  Temp: 98 F (36.7 C)  TempSrc: Oral  Weight: 280 lb 3.2 oz (127.1 kg)  Height: 5' 7" (1.702 m)   Body mass index is 43.89 kg/m.  Generalized: Well developed, in no acute distress   Neurological examination  Mentation: Alert oriented to time, place, history taking. Follows all commands speech and language fluent Cranial nerve II-XII: Pupils were equal round reactive to light. Extraocular movements were full, visual field were full on confrontational test. Facial sensation and strength were normal. Head turning and shoulder shrug were normal and symmetric. Motor: The motor testing reveals 5 over 5 strength of all 4 extremities. Good symmetric motor tone is noted throughout.  Sensory: Sensory testing is intact to soft touch on all 4 extremities. No evidence of extinction is noted.  Coordination: Cerebellar testing reveals good finger-nose-finger and heel-to-shin bilaterally.  Gait and station: Gait is normal. Reflexes: Deep tendon reflexes are symmetric, but depressed  DIAGNOSTIC DATA (LABS, IMAGING, TESTING) - I reviewed patient records, labs, notes, testing and imaging myself where available.  Lab Results  Component Value Date   WBC 8.7 02/14/2017   HGB 12.7 02/14/2017   HCT 38.4 02/14/2017   MCV 80 02/14/2017   PLT 167 02/14/2017      Component Value Date/Time   NA 141 02/14/2017 1549   K 4.4 02/14/2017 1549   CL 99 02/14/2017 1549   CO2 26 02/14/2017 1549   GLUCOSE 93 02/14/2017 1549   BUN 9 02/14/2017 1549   CREATININE 0.59 02/14/2017 1549   CALCIUM 9.4 02/14/2017 1549   PROT 7.1 02/14/2017 1549   ALBUMIN 3.9 02/14/2017 1549   AST 25 02/14/2017 1549   ALT 16 02/14/2017 1549   ALKPHOS 77 02/14/2017 1549   BILITOT <0.2 02/14/2017 1549   GFRNONAA 113 02/14/2017 1549   GFRAA 130 02/14/2017 1549   No results found for: CHOL, HDL, LDLCALC, LDLDIRECT, TRIG, CHOLHDL No results found for: HGBA1C Lab Results  Component Value  Date   VITAMINB12 255 02/14/2017   Lab Results  Component Value Date   TSH 3.530 02/14/2017   ASSESSMENT AND PLAN 44 y.o. year old female  has a past medical history of Hypertension. here with:  1.  History of pseudotumor cerebri 2.  Frequent headache with migraine features  -Reporting near daily headache, 3 significant headaches a week, stopped propanolol, never started nortriptyline, could not tolerate Effexor or Topamax  -Long discussion, with patient are these pseudotumor headaches, patient does not think so, feels different, with pseudotumor headaches, visual disturbance, gait instability, feels her head is  too heavy for her neck  -Current headaches, occipitally, bifrontal, photophobia, nausea  -Restart propanolol 40 mg twice a day  -Try Ajovy 225 mg for headache prevention  -Continue Imitrex as needed, may mix with Aleve, Zofran, tizanidine as needed  -I will see the patient back in 2 months, if headaches do not improve, should really consider if lumbar puncture is needed, patient is clear she is going to try to avoid this at all cost, pt is obese, history of pseudotumor, report of clear drainage from ears, nose, eyes-seeing allergist tomorrow  -Previous eye exam reported in September 2020, with [facility]. Tamala [facility], no papilledema  -MRI of the brain 04/26/2017 IMPRESSION: This noncontrasted MRI of the brain shows the following: 1. There are a couple small T2/FLAIR hyperintense foci in the hemispheres consistent with minimal chronic microvascular ischemic change or the sequela of prior migraine or inflammation. None of these appear to be acute. 2. Chronic left maxillary sinusitis. 3. 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 This is usually an incidental finding without significance but can also be seen in the setting of elevated intracranial pressure.  4. Multiple subcutaneous cysts, some as large as 24 mm. These are most likely sebaceous cysts though other  cystic structures cannot be ruled out. If painful or changing in size, consider resection.  I spent 25 minutes with the patient. 50% of this time was spent discussing her plan of care.  Butler Denmark, AGNP-C, DNP 04/02/2019, 11:20 AM [facility] [address] [place], [place] [address] [phone]

## 2019-04-02 NOTE — Patient Instructions (Signed)
Restart the propranolol, start taking 20 mg twice daily x 1 week then take 40 mg twice daily  Start Ajovy once a month injection for migraine headache  Use Imitrex as needed See you back in 2 months

## 2019-04-03 ENCOUNTER — Other Ambulatory Visit: Payer: Self-pay

## 2019-04-03 ENCOUNTER — Encounter: Payer: Self-pay | Admitting: Allergy

## 2019-04-03 ENCOUNTER — Ambulatory Visit (INDEPENDENT_AMBULATORY_CARE_PROVIDER_SITE_OTHER): Payer: BC Managed Care – PPO | Admitting: Allergy

## 2019-04-03 VITALS — BP 130/82 | HR 96 | Temp 98.0°F | Resp 16 | Ht 63.0 in | Wt 283.0 lb

## 2019-04-03 DIAGNOSIS — T7800XA Anaphylactic reaction due to unspecified food, initial encounter: Secondary | ICD-10-CM | POA: Diagnosis not present

## 2019-04-03 DIAGNOSIS — T7840XD Allergy, unspecified, subsequent encounter: Secondary | ICD-10-CM | POA: Diagnosis not present

## 2019-04-03 DIAGNOSIS — J3089 Other allergic rhinitis: Secondary | ICD-10-CM

## 2019-04-03 DIAGNOSIS — H1013 Acute atopic conjunctivitis, bilateral: Secondary | ICD-10-CM

## 2019-04-03 DIAGNOSIS — T63481D Toxic effect of venom of other arthropod, accidental (unintentional), subsequent encounter: Secondary | ICD-10-CM

## 2019-04-03 DIAGNOSIS — J4520 Mild intermittent asthma, uncomplicated: Secondary | ICD-10-CM | POA: Diagnosis not present

## 2019-04-03 MED ORDER — METHYLPREDNISOLONE ACETATE 40 MG/ML IJ SUSP
40.0000 mg | Freq: Once | INTRAMUSCULAR | Status: AC
  Administered 2019-04-03: 40 mg via INTRAMUSCULAR

## 2019-04-03 NOTE — Patient Instructions (Addendum)
   -    continue avoidance of salmon, tuna and nuts.     - will obtain serum IgE levels for the above foods as well as alpha gal panel.  Will obtain tryptase level to determine if you have "hyper-reactive allergy cells" that can lead to unprovoked allergic reactions.     - have access to self-injectable epinephrine (Epipen or AuviQ) 0.3mg  at all times.  Provided with AuviQ sample today    - follow emergency action plan in case of allergic reaction    - will obtain environmental allergy panel    - try Xyzal 5mg  daily.  This can replace Zyrtec if more effective.  Let know if Xyzal works or doesn't for you   - continue Singulair 10mg  daily   - continue Astelin use for nasal drainage control.  Use nasal saline spray 2-3 times a day to help keep nose moisturized.     - for itchy/watery/red eyes can use Pataday 1 drop each eye daily as needed   - allergen immunotherapy discussed today including protocol, benefits and risk.  Informational handout provided.  If interested in this therapuetic option you can check with your insurance carrier for coverage.  Let us know if you would like to proceed with this option.     - have access to albuterol inhaler 2 puffs every 4-6 hours as needed for cough/wheeze/shortness of breath/chest tightness.  May use 15-20 minutes prior to activity.   Monitor frequency of use.    Follow-up 2-3 months or sooner if needed

## 2019-04-03 NOTE — Progress Notes (Signed)
New Patient Note  RE: Christine Hanson MRN: [id number] DOB: August 20, 1975 Date of Office Visit: 04/03/2019  Referring provider: Annye English Primary care provider: Patient, No Pcp Per  Chief Complaint: Allergic reaction  History of present illness: Christine Hanson is a 44 y.o. female presenting today for consultation for allergic reaction.  In Dec 2020 she reports she had an anaphylactic reaction but is not sure what triggered this.  She states she did not eat anything she hadn't eaten before.  It was food she cooked herself and was left overs of rice, steamed veg (broccoli, carrots), chicken breast.  She does states she had a Monster drink earlier in the day.  She also states she had the same leftovers for breakfast as well. She works in a call center.  She had lunch that day in her car.  About 1.5 hrs later while working she reports tongue started to feel numb.  She reports she was not talking right.  Her tongue then started to swell and reports had throat tightness.  By the time she got to ED she states she couldn't speak however she states she did not have difficulty breathing.   She did develop hives after she arrived to the ED. she did have a CT soft tissue neck with IV contrast performed that showed no airway compromise and no evidence of edema, adenopathy or abscess.  It did show she had subcutis calcified nodules in the neck that were described to be chronic.  In the ED she was given Decadron, Benadryl, epinephrine, famotidine, IV fluids as well as Zofran.  She was able to be discharged after her symptoms improved and she was sent a prescription for an epinephrine device.  However she has not been able to pick this up due to cost as she states it was going to cost about $400 even with her insurance.  After the reaction she states she has had that same meal since without any further issues.  She states she knows that was an anaphylactic reaction as she has had  anaphylaxis before after having a penicillin-based antibiotic.  She recalls having symptoms of tongue swelling and she states she did require intubation for this.  She also reports her entire face was swollen, difficulty swallowing and throat pain.  She states she was "blacking out" on the way to the ED.    She denies having any preceding illnesses.  She denies any bites or stings.  She denies any new medications or dose changes.  She denies any new lotions, soaps or detergents. morning as well.  She states they were cleaning or spraying something in the office during lunch hour while she out and is unsure if she reacted to what ever had been sprayed in the office.   She does report having an allergy to salmon and tuna as she develops abdominal cramping, nausea, vomiting and diarrhea within 1 hr of ingestion .   her first issue with this was about 2 years ago.  She states she is able to eat every other fish and shellfish without any issue.    She does feel if she eats too many nuts she will develop stomach ache.  She tries to avoid nuts due to this.    She reports having environmental allergies.  She states she is very miserable today as she has held her allergy regimen and she is quite symptomatic.  She reports her typical symptoms include itchy watery eyes, nasal congestion and drainage,sneezing.  She has been on Singulair for the past year.  She also takes Zyrtec which she has been on for the past year.  She states she has tried Careers adviser but this stopped working years ago.  She has used Flonase but this caused her nose to bleed even with intermittent use.  She is currently using Astelin however states this irritates her nose as well.  She does report having history of asthma only with respiratory illnesses.  She also reports that with stings or fire ant bites that she will develop significant swelling at the bite site that may approach her come close to crossing the joint lines.  She would like to  know if she has any sensitivity to any stinging insects or fire ants.    Review of systems: Review of Systems  Constitutional: Negative.   HENT: Positive for congestion.   Eyes: Positive for redness.  Respiratory: Negative.   Cardiovascular: Negative.   Gastrointestinal: Negative.   Musculoskeletal: Negative.   Skin: Negative.   Neurological: Negative.     All other systems negative unless noted above in HPI  Past medical history: Past Medical History:  Diagnosis Date  . Asthma   . Eczema   . Hypertension   . Urticaria     Past surgical history: History reviewed. No pertinent surgical history.  Family history:  Family History  Problem Relation Age of Onset  . Allergic rhinitis Mother     Social history: She lives in an apartment with carpeting with electric heating and central cooling.  There are no pets in the home.  There are cats and dogs outside the home.  There is no concern for water damage, mildew or roaches in the home.  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  She denies a smoking history.  Medication List: Current Outpatient Medications  Medication Sig Dispense Refill  . buPROPion (WELLBUTRIN XL) 300 MG 24 hr tablet Take 300 mg by mouth daily.    . cetirizine (ZYRTEC) 10 MG tablet Take 10 mg by mouth daily.    Marland Kitchen lisdexamfetamine (VYVANSE) 20 MG capsule Take 20 mg by mouth daily. States she will be titrating to 40mg  daily.    . montelukast (SINGULAIR) 10 MG tablet Take 10 mg by mouth at bedtime.    . Fremanezumab-vfrm (AJOVY) 225 MG/1.5ML SOAJ Inject 225 mg into the skin every 30 (thirty) days. (Patient not taking: Reported on 04/03/2019) 1 pen 11   No current facility-administered medications for this visit.    Known medication allergies: Allergies  Allergen Reactions  . Lamictal [Lamotrigine]     Severe migraine AND rash  . Penicillins Swelling  . Tramadol     Other reaction(s): Other Headache and nausea  . Topiramate  Other (See Comments)    Cognitive fog     Physical examination: Blood pressure 130/82, pulse 96, temperature 98 F (36.7 C), temperature source Temporal, resp. rate 16, height 5\' 3"  (1.6 m), weight 283 lb (128.4 kg), SpO2 97 %.  General: Alert, interactive, in no acute distress. HEENT: PERRLA, TMs pearly gray, turbinates moderately edematous with clear discharge, post-pharynx non erythematous. Neck: Supple without lymphadenopathy. Lungs: Clear to auscultation without wheezing, rhonchi or rales. {no increased work of breathing. CV: Normal S1, S2 without murmurs. Abdomen: Nondistended, nontender. Skin: Warm and dry, without lesions or rashes. Extremities:  No clubbing, cyanosis or edema. Neuro:   Grossly intact.  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     - will obtain serum IgE levels for the above foods as well as alpha gal panel.  Will obtain tryptase level to determine if you have a mast cell activation issue that can lead to unprovoked allergic reactions.     -We will also obtain IgE to hymenoptera and fire ant.  However she does not appear to need treatment with immunotherapy at this time.    - have access to self-injectable epinephrine (Epipen or AuviQ) 0.3mg  at all times.  Provided with AuviQ sample today    - follow emergency action plan in case of allergic reaction    - will obtain environmental allergy panel    - try Xyzal 5mg  daily.  This can replace Zyrtec if more effective.  Let know if Xyzal works or doesn't for you   - continue Singulair 10mg  daily   - continue Astelin use for nasal drainage control.  Use nasal saline spray 2-3 times a day to help keep nose moisturized.     - for itchy/watery/red eyes can use Pataday  1 drop each eye daily as needed   - allergen immunotherapy discussed today including protocol, benefits and risk.  Informational handout provided.  If interested in this therapuetic option you can check with your insurance carrier for coverage.  Let us know if you would like to proceed with this option.    -Due to severity of her allergy based symptoms will treat today with a Depo-Medrol 40 mg injection.   - have access to albuterol inhaler 2 puffs every 4-6 hours as needed for cough/wheeze/shortness of breath/chest tightness.  May use 15-20 minutes prior to activity.   Monitor frequency of use.    Follow-up 2-3 months or sooner if needed  I appreciate the opportunity to take part in [place] care. Please do not hesitate to contact me with questions.  Sincerely,   [place], MD Allergy/Immunology [facility]

## 2019-04-06 ENCOUNTER — Other Ambulatory Visit: Payer: Self-pay

## 2019-04-06 ENCOUNTER — Telehealth: Payer: Self-pay

## 2019-04-06 LAB — IGE+ALLERGENS ZONE 2(30)
Alternaria Alternata IgE: <0.1 kU/L
Amer Sycamore IgE Qn: <0.1 kU/L
Aspergillus Fumigatus IgE: <0.1 kU/L
Bahia Grass IgE: <0.1 kU/L
Bermuda Grass IgE: <0.1 kU/L
Cat Dander IgE: <0.1 kU/L
Cedar, Mountain IgE: <0.1 kU/L
Cladosporium Herbarum IgE: <0.1 kU/L
Cockroach, American IgE: <0.1 kU/L
Common Silver Birch IgE: <0.1 kU/L
D Farinae IgE: 0.15 kU/L — AB
D Pteronyssinus IgE: 0.11 kU/L — AB
Dog Dander IgE: <0.1 kU/L
Elm, American IgE: <0.1 kU/L
Hickory, White IgE: <0.1 kU/L
IgE (Immunoglobulin E), Serum: 41 IU/mL (ref 6–495)
Johnson Grass IgE: <0.1 kU/L
Maple/Box Elder IgE: <0.1 kU/L
Mucor Racemosus IgE: <0.1 kU/L
Mugwort IgE Qn: <0.1 kU/L
Nettle IgE: <0.1 kU/L
Oak, White IgE: <0.1 kU/L
Penicillium Chrysogen IgE: <0.1 kU/L
Pigweed, Rough IgE: <0.1 kU/L
Plantain, English IgE: <0.1 kU/L
Ragweed, Short IgE: <0.1 kU/L
Sheep Sorrel IgE Qn: <0.1 kU/L
Stemphylium Herbarum IgE: <0.1 kU/L
Sweet gum IgE RAST Ql: <0.1 kU/L
Timothy Grass IgE: <0.1 kU/L
White Mulberry IgE: <0.1 kU/L

## 2019-04-06 LAB — TRYPTASE: Tryptase: 6.1 ug/L (ref 2.2–13.2)

## 2019-04-06 MED ORDER — OLOPATADINE HCL 0.2 % OP SOLN: OPHTHALMIC | 5 refills | Status: DC

## 2019-04-06 NOTE — Progress Notes (Signed)
I have reviewed and agreed above plan.  Please also make sure that is followed up by her ophthalmologist regularly.

## 2019-04-06 NOTE — Telephone Encounter (Signed)
Christine Hanson, please advise. It looks like the patient was prescribed a Medrol 4mg  dose pack.

## 2019-04-06 NOTE — Telephone Encounter (Signed)
Thank you. The combination of her regular regimen and the steroids should have her feeling better soon. She can double up on her Xyzal for the next few days to speed up the process. Can you please order a pataday eye drop one drop in each eye once a day as needed for her? Please let her know it may make her sleepy. Thank you. Please have her follow up with any worsening of symptoms or questions. Thank you

## 2019-04-06 NOTE — Telephone Encounter (Signed)
Spoke with patient. She verbalized that she is only experiencing itchy eyes and hoarseness. She verbalizes that she only has throat pain if she talks a lot. She denies having any fever, cough or shortness of breath. She denies any worsening symptoms and answered no to all COVID screening questions. She verbalized this all started when she stopped her allergy medication for her appointment last week and that she works for home and therefore has had no exposures.

## 2019-04-06 NOTE — Telephone Encounter (Signed)
PA for ajovy completed via covermymeds as requested by [facility]. Key: [id number].   CaseId:[id number];Status:Approved;Review Type:Prior Auth;Coverage Start Date:03/07/2019;Coverage End Date:04/05/2020;

## 2019-04-06 NOTE — Telephone Encounter (Signed)
Patient had a tele visit with her PCP Christine Hanson on yesterday (Notes are in epic).  Patient states she was given an oral steroid. Patient is feeling worse than she did when she was seen on 04/03/19 with Dr Delorse Lek. Patient states she is unsure if the xyzal is working. Patient is taking the xyzal and the Singulair. Patient wanted to make sure it is okay that she is taking the oral steroid now.   Please Advise.

## 2019-04-06 NOTE — Telephone Encounter (Signed)
Can you please find out what specific symptoms she is struggling with? Any fever? Breathing? And productive cough? Nasal drainage? And also ask what her covid exposure risk is and ask her the screening questions please. While she has received a good deal of steroid over the weekend, the montelukast, Xyzal and steroid are compatible so she can continue taking these together. Thank you

## 2019-04-06 NOTE — Telephone Encounter (Signed)
I spoke with the patient. She reports that taking the Xyzal in the evening is already making her extremely drowsy to the point where she probably won't continue with it when she returns to work. Do you recommend anything else and would you still like to send the Pataday?

## 2019-04-06 NOTE — Telephone Encounter (Signed)
Just Pataday at this time. Thank you

## 2019-04-08 ENCOUNTER — Telehealth: Payer: Self-pay

## 2019-04-08 MED ORDER — METHYLPREDNISOLONE 4 MG PO TBPK: ORAL_TABLET | ORAL | 0 refills | Status: DC

## 2019-04-08 NOTE — Telephone Encounter (Signed)
Called patient per Marylu Lund to cancel her appointment to start allergy injections. Per Delorse Lek patient's environmental allergy panel only showed a very low dust mite sensitivity thus she wants her to come back for skin testing prior to start allergy injections. Was unable to skin test her at her initial visit as she was late and needed to be someplace else and elected to do blood work. Spoke with patient and she stated that scheduling an appointment to get skin tested is going to be a problem. She stated that she is not able to come off her antihistamines. Patient stated she is still dealing with the repercussions of not taking her antihistamines for her appointment last week. Patient also informed me that she had a depo-medrol 40mg  injection at the time of her visit on 04/03/2019. Patient state that she had to contact her PCP because she wasn't feeling any better. She stated her PCP gave her some steroids and she ended up having to call them again and the gave her an antibiotic and stated she had a sinus infection. Patient stated she has not be able to go back to work since last Wednesday. Patient feels she is not able to go without her antihistamines and feels she will not be able to do the skin testing. Please see telephone encounter from Monday 04/06/2019. Please advise

## 2019-04-08 NOTE — Telephone Encounter (Signed)
Oh no sad to hear how much she is struggling with her allergies.   If Xyzal is not effective and makes her drowsy then we can try one of the prescription based antihistamines like RyVent or RyClora however these may also may her drowsy as well.   If she does not feel she can come off all antihistamines then we can absolutely do monotherapy allergy shots with dust mites as that is the sensitivity she has.  However if we are not seeing the improvement we would expect to see on allergy shots then at some point down the road we may need to perform skin testing.   I will go ahead then and make her vials for dust mites with a slightly increased volume and she can keep the previous appt she had.      Hopeful that her worsening symptoms were sinus infection related and the antibiotic and steroid course her PCP has prescribed will improve her symptoms.

## 2019-04-08 NOTE — Telephone Encounter (Signed)
Yes the sooner the better would be great while she is still on steroids.  This may be the only way for her to hold the antihistamines.    There is an appt on Thursday 3/18 at 10:20.   Can she make it to this appt for skin testing?

## 2019-04-08 NOTE — Telephone Encounter (Signed)
Called and spoke with patient and informed her of Dr. Randell Patient recommendation. Patient is wanting to do the skin testing because she needs to know what she is allergic to and causing her allergy symptoms. Patient asked how soon can she get that test done. She stated she is on steroids now and didn't know if that would be a problem. Patient stated she would like to try and get this done as soon as possible while she is out of work. She doesn't want to go back to work then get off her antihistamines  to do the test and all of this start back up and she miss work again. I informed patient I would speak with Dr. Delorse Lek about this and get back to her.

## 2019-04-08 NOTE — Telephone Encounter (Signed)
Spoke with patient and she did schedule that appointment on 04/16/2019 at 10:20. Patient stated she is going to run out of her steroids prior to that appointment and wanted to know if we could send in more. Per Dr Delorse Lek we will send in Medrol 4 mg dose pack for her to start on Sunday or Monday which ever day she stops her antihistamine. Patient has been informed and appreciates [place] working with her to help get her answers. Patient also stated that she has been out of work since Wednesday afternoon 3/3/021 and will be out until she gets better. She has requested FMLA paperwork and is wondering if Dr. Delorse Lek would mind filling them out for her. I informed patient that I would inform Dr. Delorse Lek.

## 2019-04-08 NOTE — Addendum Note (Signed)
Addended by: Dub Mikes on: 04/08/2019 05:21 PM   Modules accepted: Orders

## 2019-04-09 LAB — ALLERGENS(7)
Brazil Nut IgE: <0.1 kU/L
F020-IgE Almond: <0.1 kU/L
F202-IgE Cashew Nut: <0.1 kU/L
Hazelnut (Filbert) IgE: <0.1 kU/L
Peanut IgE: <0.1 kU/L
Pecan Nut IgE: <0.1 kU/L
Walnut IgE: <0.1 kU/L

## 2019-04-09 LAB — ALPHA-GAL PANEL
Alpha Gal IgE*: <0.1 kU/L (ref <0.10)
Beef (Bos spp) IgE: <0.1 kU/L (ref <0.35)
Class Interpretation: 0
Class Interpretation: 0
Class Interpretation: 0
Lamb/Mutton (Ovis spp) IgE: <0.1 kU/L (ref <0.35)
Pork (Sus spp) IgE: <0.1 kU/L (ref <0.35)

## 2019-04-09 LAB — ALLERGEN, SALMON, F41: Allergen Salmon IgE: <0.1 kU/L

## 2019-04-09 LAB — ALLERGEN HYMENOPTERA PANEL
Bumblebee: <0.1 kU/L
Honeybee IgE: <0.1 kU/L
Hornet, White Face, IgE: <0.1 kU/L
Hornet, Yellow, IgE: <0.1 kU/L
Paper Wasp IgE: <0.1 kU/L
Yellow Jacket, IgE: <0.1 kU/L

## 2019-04-09 LAB — ALLERGEN, TUNA F40: Tuna: <0.1 kU/L

## 2019-04-09 LAB — ALLERGEN FIRE ANT: I070-IgE Fire Ant (Invicta): <0.1 kU/L

## 2019-04-16 ENCOUNTER — Ambulatory Visit (INDEPENDENT_AMBULATORY_CARE_PROVIDER_SITE_OTHER): Payer: BC Managed Care – PPO | Admitting: Allergy

## 2019-04-16 ENCOUNTER — Encounter: Payer: Self-pay | Admitting: Allergy

## 2019-04-16 ENCOUNTER — Other Ambulatory Visit: Payer: Self-pay

## 2019-04-16 VITALS — BP 138/84 | HR 89 | Temp 97.6°F | Resp 18

## 2019-04-16 DIAGNOSIS — J4520 Mild intermittent asthma, uncomplicated: Secondary | ICD-10-CM

## 2019-04-16 DIAGNOSIS — J3089 Other allergic rhinitis: Secondary | ICD-10-CM | POA: Diagnosis not present

## 2019-04-16 DIAGNOSIS — H1013 Acute atopic conjunctivitis, bilateral: Secondary | ICD-10-CM

## 2019-04-16 DIAGNOSIS — T7800XA Anaphylactic reaction due to unspecified food, initial encounter: Secondary | ICD-10-CM | POA: Diagnosis not present

## 2019-04-16 DIAGNOSIS — T7840XD Allergy, unspecified, subsequent encounter: Secondary | ICD-10-CM | POA: Diagnosis not present

## 2019-04-16 DIAGNOSIS — T63481D Toxic effect of venom of other arthropod, accidental (unintentional), subsequent encounter: Secondary | ICD-10-CM

## 2019-04-16 MED ORDER — METHYLPREDNISOLONE ACETATE 40 MG/ML IJ SUSP
40.0000 mg | Freq: Once | INTRAMUSCULAR | Status: AC
  Administered 2019-04-16: 40 mg via INTRAMUSCULAR

## 2019-04-16 NOTE — Patient Instructions (Addendum)
   -    continue avoidance of salmon, tuna and nuts.     -  serum IgE levels were negative salmon, tuna, peanuts, tree nuts and alpha gal panel    - have access to self-injectable epinephrine (Epipen or AuviQ) 0.3mg  at all times.  Provided with AuviQ sample today    - follow emergency action plan in case of allergic reaction    -Environmental allergy panel via serum IgE was only positive to dust mites.   -Environmental allergy skin prick testing is positive to grass pollens, weed pollens, molds, dust mites and cockroach.    -We now can move forward with allergen immunotherapy and will proceed with making her vials to the above environmental allergens.  We have discussed immunotherapy protocol, benefits and risk and consent has been signed.  She will schedule for a new start appointment.  She has access to an epinephrine device.   -Resume Zyrtec.  Did provide her today with samples of RyVent so that she could try these to see if these will make her drowsy or not.  If they do not and they are more effective in controlling her allergy symptoms than Zyrtec then we can provide with a prescription.   - continue Singulair 10mg  daily   - continue Astelin use for nasal drainage control.  Use nasal saline spray 2-3 times a day to help keep nose moisturized.     - for itchy/watery/red eyes can use Pataday 1 drop each eye daily as needed   - have access to albuterol inhaler 2 puffs every 4-6 hours as needed for cough/wheeze/shortness of breath/chest tightness.  May use 15-20 minutes prior to activity.   Monitor frequency of use.    Follow-up 3 months or sooner if needed

## 2019-04-16 NOTE — Progress Notes (Signed)
Follow-up Note  RE: Christine Hanson MRN: [id number] DOB: 1975-05-29 Date of Office Visit: 04/16/2019   History of present illness: Christine Hanson is a 44 y.o. female presenting today for skin testing visit.  She was seen for her initial evaluation on 04/03/2019 by myself.  At that time we elected to do environmental allergy via serum IgE.  She was only sensitized to dust mites at a very low IgE level.  Given her severity of symptoms decided she should return for skin testing visit since we are considering starting immunotherapy.  She has held her antihistamines for this visit.  She did require systemic steroids to help control her allergy symptoms while being off of antihistamine.  She has not been able to work due to the severity of her symptoms off of her antihistamines.  She does state that the Depo-Medrol injection that she received at her initial visit did help better control her allergy symptoms and she would like to receive another dose today for better control.  She states the Xyzal made her very sleepy.  The Zyrtec did not make her sleepy but unsure of how effective it is.  Review of systems: Review of Systems  Constitutional: Negative.   HENT: Positive for congestion and sinus pain.   Eyes: Positive for redness.  Respiratory: Negative.   Cardiovascular: Negative.   Gastrointestinal: Negative.   Musculoskeletal: Negative.   Skin: Negative.   Neurological: Positive for headaches.    All other systems negative unless noted above in HPI  Past medical/social/surgical/family history have been reviewed and are unchanged unless specifically indicated below.  No changes  Medication List: Current Outpatient Medications  Medication Sig Dispense Refill  . buPROPion (WELLBUTRIN XL) 300 MG 24 hr tablet Take 300 mg by mouth daily.    . cetirizine (ZYRTEC) 10 MG tablet Take 10 mg by mouth daily.    . Fremanezumab-vfrm (AJOVY) 225 MG/1.5ML SOAJ Inject 225 mg into the skin  every 30 (thirty) days. 1 pen 11  . lisdexamfetamine (VYVANSE) 20 MG capsule Take 20 mg by mouth daily. States she will be titrating to 40mg  daily.    . methylPREDNISolone (MEDROL DOSEPAK) 4 MG TBPK tablet follow package directions 21 each 0  . montelukast (SINGULAIR) 10 MG tablet Take 10 mg by mouth at bedtime.    . Olopatadine HCl (PATADAY) 0.2 % SOLN Use one drop in each eye once daily. 2.5 mL 5   No current facility-administered medications for this visit.     Known medication allergies: Allergies  Allergen Reactions  . Lamictal [Lamotrigine]     Severe migraine AND rash  . Penicillins Swelling  . Tramadol     Other reaction(s): Other Headache and nausea  . Topiramate Other (See Comments)    Cognitive fog     Physical examination: Blood pressure 138/84, pulse 89, temperature 97.6 F (36.4 C), temperature source Temporal, resp. rate 18, SpO2 96 %.  General: Alert, interactive, in no acute distress. Skin: Warm and dry, without lesions or rashes. Extremities:  No clubbing, cyanosis or edema. Neuro:   Grossly intact.  Diagnositics/Labs: Labs:  Component     Latest Ref Rng & Units 04/03/2019 04/03/2019        12:04 PM 12:11 PM  Class Description Allergens      Comment Comment  IgE (Immunoglobulin E), Serum     6 - 495 IU/mL  41  D Pteronyssinus IgE     Class 0/I kU/L  0.11 (A)  D Farinae IgE  Class 0/I kU/L  0.15 (A)  Cat Dander IgE     Class 0 kU/L  <0.10  Dog Dander IgE     Class 0 kU/L  <0.10  [place] Grass IgE     Class 0 kU/L  <0.10  Timothy Grass IgE     Class 0 kU/L  <0.10  [place] Grass IgE     Class 0 kU/L  <0.10  [place] Grass IgE     Class 0 kU/L  <0.10  Cockroach, American IgE     Class 0 kU/L  <0.10  Penicillium Chrysogen IgE     Class 0 kU/L  <0.10  Cladosporium Herbarum IgE     Class 0 kU/L  <0.10  Aspergillus Fumigatus IgE     Class 0 kU/L  <0.10  Mucor Racemosus IgE     Class 0 kU/L  <0.10  Alternaria Alternata IgE     Class 0 kU/L   <0.10  Stemphylium Herbarum IgE     Class 0 kU/L  <0.10  Common Silver Charletta Cousin IgE     Class 0 kU/L  <0.10  Oak, White IgE     Class 0 kU/L  <0.10  Elm, American IgE     Class 0 kU/L  <0.10  Maple/Box Elder IgE     Class 0 kU/L  <0.10  Hickory, White IgE     Class 0 kU/L  <0.10  Amer Sycamore IgE Qn     Class 0 kU/L  <0.10  White Mulberry IgE     Class 0 kU/L  <0.10  Sweet gum IgE RAST Ql     Class 0 kU/L  <0.10  Cedar, [place] IgE     Class 0 kU/L  <0.10  Ragweed, Short IgE     Class 0 kU/L  <0.10  Mugwort IgE Qn     Class 0 kU/L  <0.10  Plantain, English IgE     Class 0 kU/L  <0.10  Pigweed, Rough IgE     Class 0 kU/L  <0.10  Sheep Sorrel IgE Qn     Class 0 kU/L  <0.10  Nettle IgE     Class 0 kU/L  <0.10  Peanut IgE     Class 0 kU/L <0.10   Hazelnut (Filbert) IgE     Class 0 kU/L <0.10   [place] Nut IgE     Class 0 kU/L <0.10   F020-IgE Almond     Class 0 kU/L <0.10   Pecan Nut IgE     Class 0 kU/L <0.10   F202-IgE Cashew Nut     Class 0 kU/L <0.10   Walnut IgE     Class 0 kU/L <0.10   Beef (Bos spp) IgE     <0.35 kU/L <0.10   Class Interpretation      0   Lamb/Mutton (Ovis spp) IgE     <0.35 kU/L <0.10   Class Interpretation      0   Pork (Sus spp) IgE     <0.35 kU/L <0.10   Class Interpretation      0   Alpha Gal IgE*     <0.10 kU/L <0.10   Honeybee IgE     Class 0 kU/L <0.10   Hornet, White Face, IgE     Class 0 kU/L <0.10   Yellow Jacket, IgE     Class 0 kU/L <0.10   Paper Wasp IgE     Class 0 kU/L <0.10   Hornet, Yellow, IgE  Class 0 kU/L <0.10   Bumblebee     Class 0 kU/L <0.10   Tryptase     2.2 - 13.2 ug/L  6.1  Allergen Salmon IgE     Class 0 kU/L <0.10   Tuna     Class 0 kU/L <0.10   [id number] Fire Ant (Invicta)     Class 0 kU/L <0.10     Allergy testing: Environmental allergy skin prick testing is positive to [place], Timothy, English plantain, both dust mites.  Intradermal testing is positive to mold mix 3 and  cockroach. 10 food to salmon, tuna, peanut, tree nuts, beef common allergy skin prick testing is negative. Allergy testing results were read and interpreted by provider, documented by clinical staff.   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     -  serum IgE levels were negative salmon, tuna, peanuts, tree nuts and alpha gal panel.  She would be eligible to in office challenges to these foods to rule in or out a true food allergy.    - have access to self-injectable epinephrine (Epipen or AuviQ) 0.3mg  at all times.  Provided with AuviQ sample today    - follow emergency action plan in case of allergic reaction   -Environmental allergy panel via serum IgE was only positive to dust mites.   -Environmental allergy skin prick testing is positive to grass pollens, weed pollens, molds, dust mites and cockroach.    -We now can move forward with allergen immunotherapy and will proceed with making her vials to the above environmental allergens.  We have discussed immunotherapy protocol, benefits and risk and consent has been signed.  She will schedule for a new start appointment.  She has access to an epinephrine device.   -Resume Zyrtec.  Did provide her today with samples of RyVent so that she could try these to see if these will make her drowsy or not.  If they do not and they are more effective in controlling her allergy symptoms than Zyrtec then we can provide with a prescription.   - continue Singulair 10mg  daily   - continue Astelin use for nasal drainage control.  Use nasal saline spray 2-3 times a day to help keep nose moisturized.     - for itchy/watery/red eyes can use Pataday 1 drop each eye daily as needed   -Provided her with a another Depo-Medrol 40 mg injection today as she did find benefit with it  on previous use however she must be a quick metabolizer.  - have access to albuterol inhaler 2 puffs every 4-6 hours as needed for cough/wheeze/shortness of breath/chest tightness.  May use 15-20 minutes prior to activity.   Monitor frequency of use.    Follow-up 3 months or sooner if needed   I appreciate the opportunity to take part in [place] care. Please do not hesitate to contact me with questions.  Sincerely,   Dakota dunes, MD Allergy/Immunology [facility]

## 2019-04-21 NOTE — Telephone Encounter (Signed)
Called and spoke with patient and she stated that she is taking her Zyrtec, Singulair, and Nasal Saline and Astelin spray. She states that she does not feel as bad as she felt on Friday but she does not feel as improved as she feels she should with the help of the Steroid Injection. She also stated that her ears feel clogged as well. Advised patient of regiment to help sooth throat and of ENT referral. Patient verbalized understanding. Her appointment for allergy injections to start is 05/15/2019.

## 2019-04-21 NOTE — Telephone Encounter (Signed)
She is still complaining of hoarseness, voice coming and going, drainage. Wonders if there is anything else that can be done for her. Possibly ENT referral?

## 2019-04-21 NOTE — Telephone Encounter (Signed)
She is taking antihistamine (zyrtec or ryvent), singulair and using nasal saline spray and Astelin?  She also is not even a week out from the steroid injection.  Can she tell if that has helped this time at all?  Would have her perform standard throat care including lozenges, gargling with warm salt water and hot teas with lemon/honey which all should help soothe the throat.   When is her start allergy injection appt? Yes I'm ok with ENT referral as we are doing a maximum allergy regimen from a nasal/throat standpoint.

## 2019-04-22 NOTE — Addendum Note (Signed)
Addended by: Lorrin Mais on: 04/22/2019 04:39 PM   Modules accepted: Orders

## 2019-04-23 DIAGNOSIS — J3089 Other allergic rhinitis: Secondary | ICD-10-CM | POA: Diagnosis not present

## 2019-04-23 NOTE — Progress Notes (Signed)
VIALS EXP 04-22-20

## 2019-04-24 ENCOUNTER — Ambulatory Visit: Payer: Medicare Other

## 2019-04-27 ENCOUNTER — Ambulatory Visit: Payer: Medicare Other

## 2019-04-27 DIAGNOSIS — J301 Allergic rhinitis due to pollen: Secondary | ICD-10-CM | POA: Diagnosis not present

## 2019-05-11 ENCOUNTER — Ambulatory Visit: Payer: Medicare Other

## 2019-05-15 ENCOUNTER — Ambulatory Visit (INDEPENDENT_AMBULATORY_CARE_PROVIDER_SITE_OTHER): Payer: BC Managed Care – PPO

## 2019-05-15 ENCOUNTER — Other Ambulatory Visit: Payer: Self-pay

## 2019-05-15 DIAGNOSIS — J309 Allergic rhinitis, unspecified: Secondary | ICD-10-CM

## 2019-05-15 NOTE — Progress Notes (Signed)
Immunotherapy   Patient Details  Name: Christine Hanson MRN: [id number] Date of Birth: 10-13-1975  05/15/2019   [name] started her allergy injections. Patient received 0.05 of both her blue vials. One with Pollen-Mite and the other with CR-Mold. Patient waited in an exam room for 30 minutes with no problem. Following schedule: A Frequency: once weekly Epi-Pen: Yes Consent signed and patient instructions given.   JG 05/15/2019, 9:09 AM

## 2019-06-18 ENCOUNTER — Encounter: Payer: Self-pay | Admitting: Neurology

## 2019-06-18 ENCOUNTER — Telehealth (INDEPENDENT_AMBULATORY_CARE_PROVIDER_SITE_OTHER): Payer: BC Managed Care – PPO | Admitting: Neurology

## 2019-06-18 DIAGNOSIS — G932 Benign intracranial hypertension: Secondary | ICD-10-CM | POA: Diagnosis not present

## 2019-06-18 DIAGNOSIS — G43709 Chronic migraine without aura, not intractable, without status migrainosus: Secondary | ICD-10-CM | POA: Diagnosis not present

## 2019-06-18 DIAGNOSIS — IMO0002 Reserved for concepts with insufficient information to code with codable children: Secondary | ICD-10-CM

## 2019-06-18 MED ORDER — RIZATRIPTAN BENZOATE 10 MG PO TBDP: 10.0000 mg | ORAL_TABLET | ORAL | 11 refills | Status: DC | PRN

## 2019-06-18 NOTE — Progress Notes (Signed)
Virtual Visit via Video Note  I connected with Christine Hanson on 06/18/19 at  3:15 PM EDT by a video enabled telemedicine application and verified that I am speaking with the correct person using two identifiers.  Location: Patient: in her car Provider: in the office   I discussed the limitations of evaluation and management by telemedicine and the availability of in person appointments. The patient expressed understanding and agreed to proceed.  History of Present Illness: Christine Hanson a 44 year old female, seen in refer by vocational rehabilitation service for evaluation of possible intracranial hypertension, initial evaluation was on February 14, 2017.  She recently moved from [place] to [place] in November 2018, she is here to establish her care, she was diagnosed with intracranial hypertension since 2005, also fibromyalgia, was on disability,  Per patient, who presented with frequent headaches, decreased peripheral visual field, there was CSF leaking from her ears, diagnosis of intracranial hypertension was confirmed by repetitive lumbar puncture, she has multiple hospital admission, repeat lumbar puncture 4-5 times each year initially, she was also treated with Diamox 500 mg daily, eventually was tapered of around 2017, because of her stable symptoms, no significant visual complaints anymore.  She was also treated with Lyrica up to 900 mg daily carried a diagnosis of fibromyalgia, she suffered morbid obesity, is seen by the metabolic clinic, with her diet control, she has 30 weight loss over the past few years.  She now complains of frequent headaches, bilateral frontal, temporal region severe pounding headaches with associated light noise sensitivity, nauseous, for that reason, she is taking daily Sudafed, allergy pill, herb teas have helped her headache some  UPDATE April 01 2017: Laboratory evaluations in January 2019 showed normal CPK, TSH, ESR,  ANA, vitamin D, B12, CMP, CBC -reactive protein was mildly elevated at 14.7,  She was seen by behavior health, was started on wellbutrin '400mg'$  daily with diagnosis of depression. She was treated with lamotrigine, she developed rash. Previously Topamax '150mg'$  qhs, she felt stuttering, could nto rember her wors.   I have given her a prescription of Lyrica, Maxalt as needed, and Topamax prescription at last visit in January 2019, she did not feel any of the prescription, continue complaints of frequent headaches, seem to correlate with his right ear draining,  I also filledthe vocational rehabilitation paperwork for her today,  UPDATE August 26 2018: She continue has frequent headaches, weight has been stable, reported normal recent ophthalmology evaluation by Dr. Herschel Senegal showed no evidence of papillary edema, I personally reviewed MRI of brain in April 2019, partial empty sella, no acute intracranial abnormality  She was not able to tolerate Topamax, complains of mental slowing, she has stopped Effexor, it was not helping her headache, she now complains 3-4 headaches each week, holoacranial severe pounding headache with associated light noise sensitivity, extending to her neck, lasting hours to days, she has tried Maxalt used to work well for her, no less effective also cause excessive drowsiness  Update October 13, 2018 SS: Virtual visit, she says she has intracranial HTN, and migraines. No other symptoms associated with intracranial HTN. She got new glasses, that has helped with her headaches. She is under stress with work and family. Last 1 month, her work has said she exceeded her FMLA. Her job is on the computer and on the phone. Hasn't been taking propranolol. The nortriptyline has been helpful, is sleeping better. She is having 3 migraines a week, they are less intense. Imitrex does not fully relieve  her headache. Her typical headache, wakes up in the morning once a week, the others feel  pressure in her head. She doesn't have visual disturbance. She saw eye doctor 3 weeks, Dr. Tamala Julian at [facility] care, no papilledema  Update 04/02/2019 SS: She is no longer taking propanolol, she never started nortriptyline.  She complains of daily headache, about 3 significant headaches a week.  The headaches are in the occipital area, bifrontally, with significant headache, photophobia, nausea.  When the headache is significant, will be stabbing pain, usually just dull ache.  She has history of ICH, but says these headaches are different, currently no visual disturbance, or narrowing of vision, or gait instability, with ICH headaches, feels her head is too heavy for her neck.  She has been having some drainage from her ears, eyes and nose, but is having allergy testing tomorrow, thinks more related to allergies?  She does not want LP. Does mention if she may need sleep study?  Update Jun 18, 2019 SS: Headaches much better, seemed to be related to allergies she feels, had significant sinus infection. She saw allergist.  Alford Highland once, it was helpful, did not refill it.  She has been taking propanolol in the morning on as-needed basis.  Indicates waking up with morning headache at least 3 days a week.  Since last seen, she has had 4 significant headaches, took Maxalt, had excellent benefit, but was drowsy after.  Reports with significant headache, has feeling of nausea that precedes head pain.  Denies visual disturbance, has not seen ophthalmology recently.  Indicates she does not sleep well at night, wakes up frequently, does report snoring, daytime fatigue, drowsiness, questions need for possible sleep study.  Presents today for follow-up via virtual visit. Observations/Objective: Via virtual visit, is alert and oriented, speech is clear and concise, facial symmetry noted, patient noted to be in her car  Assessment and Plan: 1.  History of pseudotumor cerebri 2.  Frequent headache with migraine  features -Indicates headaches overall improved, did 1 Ajovy injection, did not refill the medicine, felt headaches were affected by allergies -Continues to report at least 3 days a week of morning headache, not sleeping well, some snoring, daytime drowsiness, fatigue, given the symptoms, will refer her for sleep consultation, to determine if OSA may be an issue -Stop propranolol, was taking on PRN basis -Restart Ajovy for migraine prevention, only did 1 injection -Continue Maxalt as needed for acute headache -Please see ophthalmology for evaluation given history of pseudotumor cerebri, she indicates these headaches are not like her pseudotumor -I will see her back in 3 months or sooner if needed  Follow Up Instructions: 3 months 09/24/2019 3:15   I discussed the assessment and treatment plan with the patient. The patient was provided an opportunity to ask questions and all were answered. The patient agreed with the plan and demonstrated an understanding of the instructions.   The patient was advised to call back or seek an in-person evaluation if the symptoms worsen or if the condition fails to improve as anticipated.  I spent 20 minutes of face-to-face and non-face-to-face time with patient.  This included previsit chart review, lab review, study review, order entry, electronic health record documentation, patient education.  Evangeline Dakin, DNP  [facility] [address], [place], [place] [address] [phone]

## 2019-07-10 ENCOUNTER — Ambulatory Visit: Payer: Medicare Other | Admitting: Allergy

## 2019-07-13 DIAGNOSIS — F331 Major depressive disorder, recurrent, moderate: Secondary | ICD-10-CM | POA: Insufficient documentation

## 2019-07-23 ENCOUNTER — Ambulatory Visit: Payer: Medicare Other | Admitting: Allergy

## 2019-07-24 ENCOUNTER — Encounter: Payer: Self-pay | Admitting: Neurology

## 2019-08-14 ENCOUNTER — Telehealth: Payer: Self-pay | Admitting: Neurology

## 2019-08-14 NOTE — Telephone Encounter (Signed)
Pt called stating that she is now facing corrective action at work due to her not having the information she has requested from the office for her FMLA. Pt is needing a call and the information as soon as possible. Please advise.

## 2019-08-17 NOTE — Telephone Encounter (Signed)
I called pt.  She states that her FMLA [employer] is asking for more information.  recertification for 4-5-6 months, up till 08-05-19.  Certification is yearly.  Last done 08-27-2018. Gunnar Fusi is doing case, [phone].  Called to see what they need specifically. LMVM for Gunnar Fusi at [place], she was on phone or not at desk.  I asked for a call back.

## 2019-08-18 NOTE — Telephone Encounter (Signed)
I called and spoke to [facility] with [place], about pt, she stated that claim # [id number] is closed as of 07-22-19.  I relayed that per pt need recertification on 4-06/2019.  She stated that Gunnar Fusi should get back to me today if LM yesterday.  New claim was sent out to pt.  Gunnar Fusi not in office until 0800 central time.  Will wait to hear from her.

## 2019-08-20 ENCOUNTER — Ambulatory Visit: Payer: Medicare Other | Admitting: Allergy

## 2019-08-20 NOTE — Telephone Encounter (Signed)
I called and spoke to [facility] then Misty Stanley state that new claim sent out to pt to bring to providers office.  This from 08-12-19 FMLA.  I gave her fax # to send to me, she stated she would.

## 2019-08-25 NOTE — Telephone Encounter (Signed)
FMLA paperwork signed

## 2019-08-25 NOTE — Telephone Encounter (Signed)
Received FMLA form,  Completed to SS/NP for review and signature.

## 2019-08-25 NOTE — Telephone Encounter (Signed)
To MR. 

## 2019-08-26 ENCOUNTER — Telehealth: Payer: Self-pay | Admitting: *Deleted

## 2019-08-26 NOTE — Telephone Encounter (Signed)
Form faxed to [facility] on 08/25/19 [phone]

## 2019-09-24 ENCOUNTER — Ambulatory Visit (INDEPENDENT_AMBULATORY_CARE_PROVIDER_SITE_OTHER): Payer: BC Managed Care – PPO | Admitting: Neurology

## 2019-09-24 ENCOUNTER — Encounter: Payer: Self-pay | Admitting: Neurology

## 2019-09-24 ENCOUNTER — Other Ambulatory Visit: Payer: Self-pay

## 2019-09-24 VITALS — BP 163/75 | HR 98 | Wt 275.0 lb

## 2019-09-24 DIAGNOSIS — IMO0002 Reserved for concepts with insufficient information to code with codable children: Secondary | ICD-10-CM

## 2019-09-24 DIAGNOSIS — G932 Benign intracranial hypertension: Secondary | ICD-10-CM

## 2019-09-24 DIAGNOSIS — G43709 Chronic migraine without aura, not intractable, without status migrainosus: Secondary | ICD-10-CM

## 2019-09-24 MED ORDER — AJOVY 225 MG/1.5ML ~~LOC~~ SOAJ: 225.0000 mg | SUBCUTANEOUS | 11 refills | Status: DC

## 2019-09-24 NOTE — Patient Instructions (Signed)
I will refer you to Dr. Dione Booze, eye doctor  Please pick up Ajovy injection for acute migraine headache Continue Maxalt as needed for acute headache Message me about the sleep study  See you back in 4 months

## 2019-09-24 NOTE — Progress Notes (Signed)
PATIENT: Christine Hanson DOB: 19-Jul-1975  REASON FOR VISIT: follow up HISTORY FROM: patient  HISTORY OF PRESENT ILLNESS: Today 09/24/19  HISTORY Christine Ocasio-Atkinsis a 44 year old female, seen in refer by vocational rehabilitation service for evaluation of possible intracranial hypertension, initial evaluation was on February 14, 2017.  She recently moved from [place] to [place] in November 2018, she is here to establish her care, she was diagnosed with intracranial hypertension since 2005, also fibromyalgia, was on disability,  Per patient, who presented with frequent headaches, decreased peripheral visual field, there was CSF leaking from her ears, diagnosis of intracranial hypertension was confirmed by repetitive lumbar puncture, she has multiple hospital admission, repeat lumbar puncture 4-5 times each year initially, she was also treated with Diamox 500 mg daily, eventually was tapered of around 2017, because of her stable symptoms, no significant visual complaints anymore.  She was also treated with Lyrica up to 900 mg daily carried a diagnosis of fibromyalgia, she suffered morbid obesity, is seen by the metabolic clinic, with her diet control, she has 30 weight loss over the past few years.  She now complains of frequent headaches, bilateral frontal, temporal region severe pounding headaches with associated light noise sensitivity, nauseous, for that reason, she is taking daily Sudafed, allergy pill, herb teas have helped her headache some  UPDATE April 01 2017: Laboratory evaluations in January 2019 showed normal CPK, TSH, ESR, ANA, vitamin D, B12, CMP, CBC -reactive protein was mildly elevated at 14.7,  She was seen by behavior health, was started on wellbutrin 400mg  daily with diagnosis of depression. She was treated with lamotrigine, she developed rash. Previously Topamax 150mg  qhs, she felt stuttering, could nto rember her wors.   I have given her a  prescription of Lyrica, Maxalt as needed, and Topamax prescription at last visit in January 2019, she did not feel any of the prescription, continue complaints of frequent headaches, seem to correlate with his right ear draining,  I also filledthe vocational rehabilitation paperwork for her today,  UPDATE August 26 2018: She continue has frequent headaches, weight has been stable, reported normal recent ophthalmology evaluation by Dr. Herschel Senegal showed no evidence of papillary edema, I personally reviewed MRI of brain in April 2019, partial empty sella, no acute intracranial abnormality  She was not able to tolerate Topamax, complains of mental slowing, she has stopped Effexor, it was not helping her headache, she now complains 3-4 headaches each week, holoacranial severe pounding headache with associated light noise sensitivity, extending to her neck, lasting hours to days, she has tried Maxalt used to work well for her, no less effective also cause excessive drowsiness  Update October 13, 2018 SS: Virtual visit, she says she has intracranial HTN, and migraines. No other symptoms associated with intracranial HTN. She got new glasses, that has helped with her headaches. She is under stress with work and family. Last 1 month, her work has said she exceeded her FMLA. Her job is on the computer and on the phone. Hasn't been taking propranolol. The nortriptyline has been helpful, is sleeping better. She is having 3 migraines a week, they are less intense. Imitrex does not fully relieve her headache. Her typical headache, wakes up in the morning once a week, the others feel pressure in her head. She doesn't have visual disturbance. She saw eye doctor 3 weeks, Dr. Tamala Julian at [facility] care, no papilledema  Update 3/4/2021SS:She is no longer taking propanolol, she never started nortriptyline. She complains of daily headache,  about 3 significant headaches a week. The headaches are in the occipital area,  bifrontally, with significant headache, photophobia, nausea. When the headache is significant, will be stabbing pain, usually just dull ache. She has history of ICH,but says these headaches are different, currently no visual disturbance,or narrowing of vision, or gait instability, with ICHheadaches, feels her head is too heavy for her neck.She has been having some drainage from her ears, eyes and nose,but is having allergy testing tomorrow, thinks more related toallergies?She does not want LP. Does mentionif she may need sleep study?  Update Jun 18, 2019 SS: Headaches much better, seemed to be related to allergies she feels, had significant sinus infection. She saw allergist.  Alford Highland once, it was helpful, did not refill it.  She has been taking propanolol in the morning on as-needed basis.  Indicates waking up with morning headache at least 3 days a week.  Since last seen, she has had 4 significant headaches, took Maxalt, had excellent benefit, but was drowsy after.  Reports with significant headache, has feeling of nausea that precedes head pain.  Denies visual disturbance, has not seen ophthalmology recently.  Indicates she does not sleep well at night, wakes up frequently, does report snoring, daytime fatigue, drowsiness, questions need for possible sleep study.  Presents today for follow-up via virtual visit.  Update September 24, 2019 SS: Migraines overall improved, never picked up the Ajovy, only did 1 injection a few months ago.  On average, 2 significant migraines a month, excellent benefit with Maxalt.  Continues to report 4/7 days of morning headache, has snoring, daytime drowsiness, fatigue, referred for sleep study, they called, she didn't call back.  Due for eye exam, missed her appointment.  History of IIH, could not tolerate Diamox or Topamax due to reported side effects, headaches currently not consistent with IIH, does have some occasional blurring vision.  Weight is stable, seeing  psychiatrist for depression, just put on Lexapro.  Works for [name].  REVIEW OF SYSTEMS: Out of a complete 14 system review of symptoms, the patient complains only of the following symptoms, and all other reviewed systems are negative.  headahce  ALLERGIES: Allergies  Allergen Reactions  . Lamictal [Lamotrigine]     Severe migraine AND rash  . Penicillins Swelling  . Tramadol     Other reaction(s): Other Headache and nausea  . Topiramate Other (See Comments)    Cognitive fog    HOME MEDICATIONS: Outpatient Medications Prior to Visit  Medication Sig Dispense Refill  . ALPRAZolam (XANAX) 0.5 MG tablet Take 0.5 mg by mouth at bedtime as needed for anxiety.    Marland Kitchen buPROPion (WELLBUTRIN XL) 300 MG 24 hr tablet Take 300 mg by mouth daily.    . cetirizine (ZYRTEC) 10 MG tablet Take 10 mg by mouth daily.    Marland Kitchen escitalopram (LEXAPRO) 5 MG tablet Take 5 mg by mouth daily. 1/2 tab daily    . lisdexamfetamine (VYVANSE) 20 MG capsule Take 20 mg by mouth daily. States she will be titrating to $RemoveBefo'40mg'zKubKouWydW$  daily.    . montelukast (SINGULAIR) 10 MG tablet Take 10 mg by mouth at bedtime.    . rizatriptan (MAXALT-MLT) 10 MG disintegrating tablet Take 1 tablet (10 mg total) by mouth as needed for migraine (may repeat 2 hours after if needed). May repeat in 2 hours if needed 10 tablet 11  . Fremanezumab-vfrm (AJOVY) 225 MG/1.5ML SOAJ Inject 225 mg into the skin every 30 (thirty) days. 1 pen 11  . methylPREDNISolone (MEDROL  DOSEPAK) 4 MG TBPK tablet follow package directions 21 each 0  . Olopatadine HCl (PATADAY) 0.2 % SOLN Use one drop in each eye once daily. 2.5 mL 5   No facility-administered medications prior to visit.    PAST MEDICAL HISTORY: Past Medical History:  Diagnosis Date  . Asthma   . Eczema   . Hypertension   . Urticaria     PAST SURGICAL HISTORY: No past surgical history on file.  FAMILY HISTORY: Family History  Problem Relation Age of Onset  . Allergic rhinitis Mother      SOCIAL HISTORY: Social History   Socioeconomic History  . Marital status: Significant Other    Spouse name: Not on file  . Number of children: Not on file  . Years of education: Not on file  . Highest education level: Not on file  Occupational History  . Not on file  Tobacco Use  . Smoking status: Former Research scientist (life sciences)  . Smokeless tobacco: Never Used  Substance and Sexual Activity  . Alcohol use: No  . Drug use: No  . Sexual activity: Not on file  Other Topics Concern  . Not on file  Social History Narrative  . Not on file   Social Determinants of Health   Financial Resource Strain:   . Difficulty of Paying Living Expenses: Not on file  Food Insecurity:   . Worried About Charity fundraiser in the Last Year: Not on file  . Ran Out of Food in the Last Year: Not on file  Transportation Needs:   . Lack of Transportation (Medical): Not on file  . Lack of Transportation (Non-Medical): Not on file  Physical Activity:   . Days of Exercise per Week: Not on file  . Minutes of Exercise per Session: Not on file  Stress:   . Feeling of Stress : Not on file  Social Connections:   . Frequency of Communication with Friends and Family: Not on file  . Frequency of Social Gatherings with Friends and Family: Not on file  . Attends Religious Services: Not on file  . Active Member of Clubs or Organizations: Not on file  . Attends Archivist Meetings: Not on file  . Marital Status: Not on file  Intimate Partner Violence:   . Fear of Current or Ex-Partner: Not on file  . Emotionally Abused: Not on file  . Physically Abused: Not on file  . Sexually Abused: Not on file   PHYSICAL EXAM  Vitals:   09/24/19 1527 09/24/19 1531  BP: (!) 173/86 (!) 163/75  Pulse: (!) 103 98  Weight: 275 lb (124.7 kg)    Body mass index is 48.71 kg/m.  Generalized: Well developed, in no acute distress   Neurological examination  Mentation: Alert oriented to time, place, history taking.  Follows all commands speech and language fluent Cranial nerve II-XII: Pupils were equal round reactive to light. Extraocular movements were full, visual field were full on confrontational test. Facial sensation and strength were normal.  Head turning and shoulder shrug  were normal and symmetric. Motor: The motor testing reveals 5 over 5 strength of all 4 extremities. Good symmetric motor tone is noted throughout.  Sensory: Sensory testing is intact to soft touch on all 4 extremities. No evidence of extinction is noted.  Coordination: Cerebellar testing reveals good finger-nose-finger and heel-to-shin bilaterally.  Gait and station: Gait is normal.  Reflexes: Deep tendon reflexes are symmetric and normal bilaterally.   DIAGNOSTIC DATA (LABS, IMAGING, TESTING) -  I reviewed patient records, labs, notes, testing and imaging myself where available.  Lab Results  Component Value Date   WBC 8.7 02/14/2017   HGB 12.7 02/14/2017   HCT 38.4 02/14/2017   MCV 80 02/14/2017   PLT 167 02/14/2017      Component Value Date/Time   NA 141 02/14/2017 1549   K 4.4 02/14/2017 1549   CL 99 02/14/2017 1549   CO2 26 02/14/2017 1549   GLUCOSE 93 02/14/2017 1549   BUN 9 02/14/2017 1549   CREATININE 0.59 02/14/2017 1549   CALCIUM 9.4 02/14/2017 1549   PROT 7.1 02/14/2017 1549   ALBUMIN 3.9 02/14/2017 1549   AST 25 02/14/2017 1549   ALT 16 02/14/2017 1549   ALKPHOS 77 02/14/2017 1549   BILITOT <0.2 02/14/2017 1549   GFRNONAA 113 02/14/2017 1549   GFRAA 130 02/14/2017 1549   No results found for: CHOL, HDL, LDLCALC, LDLDIRECT, TRIG, CHOLHDL No results found for: HGBA1C Lab Results  Component Value Date   VITAMINB12 255 02/14/2017   Lab Results  Component Value Date   TSH 3.530 02/14/2017    ASSESSMENT AND PLAN 44 y.o. year old female  has a past medical history of Asthma, Eczema, Hypertension, and Urticaria. here with:  1.  History of pseudotumor cerebri 2.  Frequent headaches with migraine  features  -Referred to ophthalmology, Dr. Katy Fitch for evaluation of papilledema with history of pseudotumor  -Continue Ajovy 225 mg monthly injection for migraine prevention, she needs to pick this up  -Continue Maxalt as needed for acute headache  -Concern for potential OSA, previously been referred for sleep evaluation, she did not follow through with referral, she wants to hold off on this for now  -Follow-up in 4 months or sooner if needed  I spent 30 minutes of face-to-face and non-face-to-face time with patient.  This included previsit chart review, lab review, study review, order entry, electronic health record documentation, patient education.  Butler Denmark, AGNP-C, DNP 09/24/2019, 3:56 PM [facility] [address] [place], [place] [address] [phone]

## 2019-12-31 ENCOUNTER — Other Ambulatory Visit: Payer: Self-pay | Admitting: Otolaryngology

## 2020-01-01 ENCOUNTER — Encounter (HOSPITAL_COMMUNITY): Payer: Self-pay | Admitting: Otolaryngology

## 2020-01-01 NOTE — Progress Notes (Signed)
PCP:  Maud Deed, MD Cardiologist:  Denies  EKG:  DOS CXR:  Denies ECHO:  Denies Stress Test: Denies Cardiac Cath:  Denies  Fasting Blood Sugar-  N/A Checks Blood Sugar__N/A_ times a day  OSA/CPAP:  NO  ASA/Blood Thinners:  No  Covid test DOS  Anesthesia Review:  No  Patient denies shortness of breath, fever, cough, and chest pain at PAT appointment.  Patient verbalized understanding of instructions provided today at the PAT appointment.  Patient asked to review instructions at home and day of surgery.

## 2020-01-05 ENCOUNTER — Other Ambulatory Visit: Payer: Self-pay

## 2020-01-05 ENCOUNTER — Encounter (HOSPITAL_COMMUNITY): Payer: Self-pay | Admitting: Otolaryngology

## 2020-01-05 ENCOUNTER — Ambulatory Visit (HOSPITAL_COMMUNITY): Payer: BC Managed Care – PPO | Admitting: Certified Registered Nurse Anesthetist

## 2020-01-05 ENCOUNTER — Ambulatory Visit (HOSPITAL_COMMUNITY)
Admission: RE | Admit: 2020-01-05 | Discharge: 2020-01-05 | Disposition: A | Discharge status: Home / Self Care | Payer: BC Managed Care – PPO | Source: Ambulatory Visit | Attending: Otolaryngology | Admitting: Otolaryngology

## 2020-01-05 ENCOUNTER — Encounter (HOSPITAL_COMMUNITY)
Admission: RE | Disposition: A | Discharge status: Home / Self Care | Payer: Self-pay | Source: Ambulatory Visit | Attending: Otolaryngology

## 2020-01-05 DIAGNOSIS — J45909 Unspecified asthma, uncomplicated: Secondary | ICD-10-CM | POA: Insufficient documentation

## 2020-01-05 DIAGNOSIS — J324 Chronic pansinusitis: Secondary | ICD-10-CM | POA: Insufficient documentation

## 2020-01-05 DIAGNOSIS — G932 Benign intracranial hypertension: Secondary | ICD-10-CM | POA: Insufficient documentation

## 2020-01-05 DIAGNOSIS — J343 Hypertrophy of nasal turbinates: Secondary | ICD-10-CM | POA: Insufficient documentation

## 2020-01-05 DIAGNOSIS — Z79899 Other long term (current) drug therapy: Secondary | ICD-10-CM | POA: Diagnosis not present

## 2020-01-05 DIAGNOSIS — Z87891 Personal history of nicotine dependence: Secondary | ICD-10-CM | POA: Insufficient documentation

## 2020-01-05 HISTORY — PX: SINUS ENDO W/FUSION: SHX777

## 2020-01-05 HISTORY — PX: SEPTOPLASTY WITH ETHMOIDECTOMY, AND MAXILLARY ANTROSTOMY: SHX6090

## 2020-01-05 HISTORY — PX: SINUS ENDO WITH FUSION: SHX5329

## 2020-01-05 LAB — BASIC METABOLIC PANEL
Anion gap: 13 (ref 5–15)
BUN: 10 mg/dL (ref 6–20)
CO2: 25 mmol/L (ref 22–32)
Calcium: 9.1 mg/dL (ref 8.9–10.3)
Chloride: 99 mmol/L (ref 98–111)
Creatinine, Ser: 0.66 mg/dL (ref 0.44–1.00)
GFR, Estimated: >60 mL/min (ref >60)
Glucose, Bld: 85 mg/dL (ref 70–99)
Potassium: 3.3 mmol/L — ABNORMAL LOW (ref 3.5–5.1)
Sodium: 137 mmol/L (ref 135–145)

## 2020-01-05 LAB — CBC
HCT: 38.6 % (ref 36.0–46.0)
Hemoglobin: 12.3 g/dL (ref 12.0–15.0)
MCH: 27.1 pg (ref 26.0–34.0)
MCHC: 31.9 g/dL (ref 30.0–36.0)
MCV: 85 fL (ref 80.0–100.0)
Platelets: 191 10*3/uL (ref 150–400)
RBC: 4.54 MIL/uL (ref 3.87–5.11)
RDW: 16.3 % — ABNORMAL HIGH (ref 11.5–15.5)
WBC: 14.6 10*3/uL — ABNORMAL HIGH (ref 4.0–10.5)
nRBC: 0 % (ref 0.0–0.2)

## 2020-01-05 LAB — POCT PREGNANCY, URINE: Preg Test, Ur: NEGATIVE

## 2020-01-05 SURGERY — SINUS SURGERY, ENDOSCOPIC, USING COMPUTER-ASSISTED NAVIGATION
Anesthesia: General | Site: Nose | Laterality: Left

## 2020-01-05 MED ORDER — PROPOFOL 10 MG/ML IV BOLUS
INTRAVENOUS | Status: DC | PRN
  Administered 2020-01-05: 200 mg via INTRAVENOUS
  Administered 2020-01-05: 50 mg via INTRAVENOUS

## 2020-01-05 MED ORDER — ROCURONIUM BROMIDE 10 MG/ML (PF) SYRINGE
PREFILLED_SYRINGE | INTRAVENOUS | Status: DC | PRN
  Administered 2020-01-05: 30 mg via INTRAVENOUS
  Administered 2020-01-05: 70 mg via INTRAVENOUS

## 2020-01-05 MED ORDER — DOCUSATE SODIUM 100 MG PO CAPS: 100.0000 mg | ORAL_CAPSULE | Freq: Two times a day (BID) | ORAL | 0 refills | Status: AC | PRN

## 2020-01-05 MED ORDER — ONDANSETRON HCL 4 MG/2ML IJ SOLN
INTRAMUSCULAR | Status: DC | PRN
  Administered 2020-01-05: 4 mg via INTRAVENOUS

## 2020-01-05 MED ORDER — CIPROFLOXACIN-DEXAMETHASONE 0.3-0.1 % OT SUSP
OTIC | Status: DC | PRN
  Administered 2020-01-05: 4 [drp]

## 2020-01-05 MED ORDER — PHENYLEPHRINE 40 MCG/ML (10ML) SYRINGE FOR IV PUSH (FOR BLOOD PRESSURE SUPPORT)
PREFILLED_SYRINGE | INTRAVENOUS | Status: DC | PRN
  Administered 2020-01-05 (×2): 120 ug via INTRAVENOUS
  Administered 2020-01-05: 80 ug via INTRAVENOUS
  Administered 2020-01-05: 120 ug via INTRAVENOUS
  Administered 2020-01-05: 80 ug via INTRAVENOUS

## 2020-01-05 MED ORDER — PHENYLEPHRINE HCL-NACL 10-0.9 MG/250ML-% IV SOLN
INTRAVENOUS | Status: DC | PRN
  Administered 2020-01-05: 25 ug/min via INTRAVENOUS

## 2020-01-05 MED ORDER — MEPERIDINE HCL 25 MG/ML IJ SOLN: 6.2500 mg | INTRAMUSCULAR | Status: DC | PRN

## 2020-01-05 MED ORDER — OXYCODONE HCL 5 MG PO TABS
ORAL_TABLET | ORAL | Status: AC
  Filled 2020-01-05: qty 1

## 2020-01-05 MED ORDER — OXYCODONE HCL 5 MG/5ML PO SOLN: 5.0000 mg | Freq: Once | ORAL | Status: AC | PRN

## 2020-01-05 MED ORDER — MUPIROCIN 2 % EX OINT
TOPICAL_OINTMENT | CUTANEOUS | Status: AC
  Filled 2020-01-05: qty 22

## 2020-01-05 MED ORDER — HYDROMORPHONE HCL 1 MG/ML IJ SOLN
INTRAMUSCULAR | Status: AC
  Administered 2020-01-05: 0.5 mg via INTRAVENOUS
  Filled 2020-01-05: qty 1

## 2020-01-05 MED ORDER — MIDAZOLAM HCL 2 MG/2ML IJ SOLN: 0.5000 mg | Freq: Once | INTRAMUSCULAR | Status: DC | PRN

## 2020-01-05 MED ORDER — CHLORHEXIDINE GLUCONATE 0.12 % MT SOLN: 15.0000 mL | Freq: Once | OROMUCOSAL | Status: AC

## 2020-01-05 MED ORDER — OXYCODONE HCL 5 MG PO TABS
5.0000 mg | ORAL_TABLET | Freq: Once | ORAL | Status: AC | PRN
  Administered 2020-01-05: 5 mg via ORAL

## 2020-01-05 MED ORDER — HYDROMORPHONE HCL 1 MG/ML IJ SOLN
0.2500 mg | INTRAMUSCULAR | Status: DC | PRN
  Administered 2020-01-05: 0.5 mg via INTRAVENOUS

## 2020-01-05 MED ORDER — SULFAMETHOXAZOLE-TRIMETHOPRIM 800-160 MG PO TABS: 1.0000 | ORAL_TABLET | Freq: Two times a day (BID) | ORAL | 0 refills | Status: AC

## 2020-01-05 MED ORDER — MIDAZOLAM HCL 5 MG/5ML IJ SOLN
INTRAMUSCULAR | Status: DC | PRN
  Administered 2020-01-05: 2 mg via INTRAVENOUS

## 2020-01-05 MED ORDER — SALINE SPRAY 0.65 % NA SOLN: 4.0000 | NASAL | 0 refills | Status: AC

## 2020-01-05 MED ORDER — LIDOCAINE HCL (PF) 2 % IJ SOLN
INTRAMUSCULAR | Status: AC
  Filled 2020-01-05: qty 5

## 2020-01-05 MED ORDER — SUGAMMADEX SODIUM 200 MG/2ML IV SOLN
INTRAVENOUS | Status: DC | PRN
  Administered 2020-01-05: 300 mg via INTRAVENOUS

## 2020-01-05 MED ORDER — CHLORHEXIDINE GLUCONATE 0.12 % MT SOLN
OROMUCOSAL | Status: AC
  Administered 2020-01-05: 15 mL via OROMUCOSAL
  Filled 2020-01-05: qty 15

## 2020-01-05 MED ORDER — OXYMETAZOLINE HCL 0.05 % NA SOLN
NASAL | Status: AC
  Filled 2020-01-05: qty 30

## 2020-01-05 MED ORDER — EPHEDRINE SULFATE-NACL 50-0.9 MG/10ML-% IV SOSY
PREFILLED_SYRINGE | INTRAVENOUS | Status: DC | PRN
  Administered 2020-01-05: 10 mg via INTRAVENOUS

## 2020-01-05 MED ORDER — SCOPOLAMINE 1 MG/3DAYS TD PT72
1.0000 | MEDICATED_PATCH | TRANSDERMAL | Status: DC
  Administered 2020-01-05: 1.5 mg via TRANSDERMAL
  Filled 2020-01-05: qty 1

## 2020-01-05 MED ORDER — OXYMETAZOLINE HCL 0.05 % NA SOLN
NASAL | Status: DC | PRN
  Administered 2020-01-05: 1

## 2020-01-05 MED ORDER — PROPOFOL 10 MG/ML IV BOLUS
INTRAVENOUS | Status: AC
  Filled 2020-01-05: qty 20

## 2020-01-05 MED ORDER — MIDAZOLAM HCL 2 MG/2ML IJ SOLN
INTRAMUSCULAR | Status: AC
  Filled 2020-01-05: qty 2

## 2020-01-05 MED ORDER — FENTANYL CITRATE (PF) 250 MCG/5ML IJ SOLN
INTRAMUSCULAR | Status: AC
  Filled 2020-01-05: qty 5

## 2020-01-05 MED ORDER — DEXAMETHASONE SODIUM PHOSPHATE 10 MG/ML IJ SOLN
INTRAMUSCULAR | Status: AC
  Filled 2020-01-05: qty 1

## 2020-01-05 MED ORDER — LIDOCAINE 2% (20 MG/ML) 5 ML SYRINGE
INTRAMUSCULAR | Status: DC | PRN
  Administered 2020-01-05: 20 mg via INTRAVENOUS

## 2020-01-05 MED ORDER — DEXAMETHASONE SODIUM PHOSPHATE 10 MG/ML IJ SOLN
INTRAMUSCULAR | Status: DC | PRN
  Administered 2020-01-05 (×2): 5 mg via INTRAVENOUS

## 2020-01-05 MED ORDER — FENTANYL CITRATE (PF) 250 MCG/5ML IJ SOLN
INTRAMUSCULAR | Status: DC | PRN
  Administered 2020-01-05: 50 ug via INTRAVENOUS
  Administered 2020-01-05: 100 ug via INTRAVENOUS
  Administered 2020-01-05 (×2): 50 ug via INTRAVENOUS

## 2020-01-05 MED ORDER — PROMETHAZINE HCL 25 MG/ML IJ SOLN: 6.2500 mg | INTRAMUSCULAR | Status: DC | PRN

## 2020-01-05 MED ORDER — ORAL CARE MOUTH RINSE: 15.0000 mL | Freq: Once | OROMUCOSAL | Status: AC

## 2020-01-05 MED ORDER — CEFAZOLIN SODIUM-DEXTROSE 2-4 GM/100ML-% IV SOLN
INTRAVENOUS | Status: AC
  Filled 2020-01-05: qty 100

## 2020-01-05 MED ORDER — EPHEDRINE 5 MG/ML INJ
INTRAVENOUS | Status: AC
  Filled 2020-01-05: qty 10

## 2020-01-05 MED ORDER — ROCURONIUM BROMIDE 10 MG/ML (PF) SYRINGE
PREFILLED_SYRINGE | INTRAVENOUS | Status: AC
  Filled 2020-01-05: qty 10

## 2020-01-05 MED ORDER — ACETAMINOPHEN 500 MG PO TABS
1000.0000 mg | ORAL_TABLET | Freq: Once | ORAL | Status: AC
  Administered 2020-01-05: 1000 mg via ORAL
  Filled 2020-01-05: qty 2

## 2020-01-05 MED ORDER — VANCOMYCIN HCL 1500 MG/300ML IV SOLN
1500.0000 mg | INTRAVENOUS | Status: AC
  Administered 2020-01-05: 1500 mg via INTRAVENOUS
  Filled 2020-01-05: qty 300

## 2020-01-05 MED ORDER — HYDROCODONE-ACETAMINOPHEN 5-325 MG PO TABS: 1.0000 | ORAL_TABLET | ORAL | 0 refills | Status: AC | PRN

## 2020-01-05 MED ORDER — LIDOCAINE-EPINEPHRINE 1 %-1:100000 IJ SOLN
INTRAMUSCULAR | Status: DC | PRN
  Administered 2020-01-05: 7 mL

## 2020-01-05 MED ORDER — EPINEPHRINE HCL (NASAL) 0.1 % NA SOLN
NASAL | Status: AC
  Filled 2020-01-05: qty 30

## 2020-01-05 MED ORDER — SODIUM CHLORIDE 0.9 % IR SOLN
Status: DC | PRN
  Administered 2020-01-05: 1000 mL

## 2020-01-05 MED ORDER — BACITRACIN ZINC 500 UNIT/GM EX OINT
TOPICAL_OINTMENT | CUTANEOUS | Status: AC
  Filled 2020-01-05: qty 28.35

## 2020-01-05 MED ORDER — LIDOCAINE-EPINEPHRINE 1 %-1:100000 IJ SOLN
INTRAMUSCULAR | Status: AC
  Filled 2020-01-05: qty 1

## 2020-01-05 MED ORDER — 0.9 % SODIUM CHLORIDE (POUR BTL) OPTIME
TOPICAL | Status: DC | PRN
  Administered 2020-01-05: 1000 mL

## 2020-01-05 MED ORDER — ONDANSETRON HCL 4 MG/2ML IJ SOLN
INTRAMUSCULAR | Status: AC
  Filled 2020-01-05: qty 2

## 2020-01-05 MED ORDER — PHENYLEPHRINE 40 MCG/ML (10ML) SYRINGE FOR IV PUSH (FOR BLOOD PRESSURE SUPPORT)
PREFILLED_SYRINGE | INTRAVENOUS | Status: AC
  Filled 2020-01-05: qty 10

## 2020-01-05 MED ORDER — LACTATED RINGERS IV SOLN: INTRAVENOUS | Status: DC

## 2020-01-05 SURGICAL SUPPLY — 50 items
BLADE INF TURB ROT M4 2 5PK (BLADE) ×3 IMPLANT
BLADE RAD40 ROTATE 4M 4 5PK (BLADE) IMPLANT
BLADE RAD60 ROTATE M4 4 5PK (BLADE) IMPLANT
BLADE ROTATE RAD 40 4 M4 (BLADE) ×3 IMPLANT
BLADE ROTATE TRICUT 4X13 M4 (BLADE) ×3 IMPLANT
BLADE TRICUT ROTATE M4 4 5PK (BLADE) ×3 IMPLANT
CANISTER SUCT 3000ML PPV (MISCELLANEOUS) ×3 IMPLANT
CLSR STERI-STRIP ANTIMIC 1/2X4 (GAUZE/BANDAGES/DRESSINGS) IMPLANT
COAGULATOR SUCT SWTCH 10FR 6 (ELECTROSURGICAL) IMPLANT
COVER WAND RF STERILE (DRAPES) ×3 IMPLANT
DRAPE HALF SHEET 40X57 (DRAPES) ×3 IMPLANT
DRESSING NASAL POPE 10X1.5X2.5 (GAUZE/BANDAGES/DRESSINGS) IMPLANT
DRSG NASAL POPE 10X1.5X2.5 (GAUZE/BANDAGES/DRESSINGS)
DRSG NASOPORE 8CM (GAUZE/BANDAGES/DRESSINGS) ×3 IMPLANT
DRSG TELFA 3X8 NADH (GAUZE/BANDAGES/DRESSINGS) IMPLANT
ELECT REM PT RETURN 9FT ADLT (ELECTROSURGICAL) ×3
ELECTRODE REM PT RTRN 9FT ADLT (ELECTROSURGICAL) ×2 IMPLANT
GLOVE BIO SURGEON STRL SZ 6.5 (GLOVE) ×3 IMPLANT
GLOVE BIO SURGEON STRL SZ7.5 (GLOVE) ×3 IMPLANT
GOWN STRL REUS W/ TWL LRG LVL3 (GOWN DISPOSABLE) ×4 IMPLANT
GOWN STRL REUS W/TWL LRG LVL3 (GOWN DISPOSABLE) ×2
KIT BASIN OR (CUSTOM PROCEDURE TRAY) ×3 IMPLANT
KIT TURNOVER KIT B (KITS) ×3 IMPLANT
NEEDLE HYPO 25GX1X1/2 BEV (NEEDLE) ×3 IMPLANT
NEEDLE HYPO 25X1 1.5 SAFETY (NEEDLE) ×3 IMPLANT
NEEDLE PRECISIONGLIDE 27X1.5 (NEEDLE) ×3 IMPLANT
NEEDLE SPNL 22GX3.5 QUINCKE BK (NEEDLE) ×3 IMPLANT
NS IRRIG 1000ML POUR BTL (IV SOLUTION) ×3 IMPLANT
PAD ARMBOARD 7.5X6 YLW CONV (MISCELLANEOUS) ×6 IMPLANT
PAD ENT ADHESIVE 25PK (MISCELLANEOUS) ×3 IMPLANT
PATTIES SURGICAL .5 X3 (DISPOSABLE) ×3 IMPLANT
POSITIONER HEAD DONUT 9IN (MISCELLANEOUS) ×3 IMPLANT
SHEATH ENDOSCRUB 0 DEG (SHEATH) ×3 IMPLANT
SHEATH ENDOSCRUB 30 DEG (SHEATH) ×3 IMPLANT
SHEATH ENDOSCRUB 45 DEG (SHEATH) IMPLANT
SOL ANTI FOG 6CC (MISCELLANEOUS) ×2 IMPLANT
SOLUTION ANTI FOG 6CC (MISCELLANEOUS) ×1
SPLINT NASAL DOYLE BI-VL (GAUZE/BANDAGES/DRESSINGS) ×3 IMPLANT
SUT CHROMIC 4 0 PS 2 18 (SUTURE) IMPLANT
SUT CHROMIC GUT 2 0 PS 2 27 (SUTURE) ×6 IMPLANT
SUT ETHILON 3 0 PS 1 (SUTURE) IMPLANT
SWAB COLLECTION DEVICE MRSA (MISCELLANEOUS) ×3 IMPLANT
SYR 50ML SLIP (SYRINGE) IMPLANT
SYR TB 1ML LUER SLIP (SYRINGE) ×6 IMPLANT
TOWEL GREEN STERILE FF (TOWEL DISPOSABLE) ×3 IMPLANT
TRACKER ENT INSTRUMENT (MISCELLANEOUS) ×6 IMPLANT
TRACKER ENT PATIENT (MISCELLANEOUS) ×3 IMPLANT
TRAY ENT MC OR (CUSTOM PROCEDURE TRAY) ×3 IMPLANT
TUBE CONNECTING 12X1/4 (SUCTIONS) ×3 IMPLANT
TUBING STRAIGHTSHOT EPS 5PK (TUBING) ×3 IMPLANT

## 2020-01-05 NOTE — Addendum Note (Signed)
Addended by: Herberta Pickron A on: 01/05/2020 10:47 AM   Modules accepted: Orders  

## 2020-01-05 NOTE — Anesthesia Procedure Notes (Signed)
Procedure Name: Intubation Date/Time: 01/05/2020 12:15 PM Performed by: Waynard Edwards, CRNA Pre-anesthesia Checklist: Patient identified, Emergency Drugs available, Suction available and Patient being monitored Patient Re-evaluated:Patient Re-evaluated prior to induction Oxygen Delivery Method: Circle system utilized Preoxygenation: Pre-oxygenation with 100% oxygen Induction Type: IV induction Ventilation: Mask ventilation without difficulty and Oral airway inserted - appropriate to patient size Laryngoscope Size: Hyacinth Meeker and 2 Grade View: Grade II Tube type: Oral Tube size: 7.5 mm Number of attempts: 1 Airway Equipment and Method: Stylet and Oral airway Placement Confirmation: ETT inserted through vocal cords under direct vision,  positive ETCO2 and breath sounds checked- equal and bilateral Secured at: 22 cm Tube secured with: Tape Dental Injury: Teeth and Oropharynx as per pre-operative assessment

## 2020-01-05 NOTE — Transfer of Care (Signed)
Immediate Anesthesia Transfer of Care Note  Patient: Christine Hanson  Procedure(s) Performed: ENDOSCOPIC SINUS SURGERY WITH NAVIGATION (Left Nose) TOTAL ETHMOIDECTOMY, MAXILLARY ANTROSTOMY WITH STRIPPING SPHENOIDOTOMY, FRONTAL SINUSOTOMY WITH TISSUE REMOVAL (Left Nose) SINUS ENDO WITH FUSION (Left Nose)  Patient Location: PACU  Anesthesia Type:General  Level of Consciousness: drowsy and patient cooperative  Airway & Oxygen Therapy: Patient Spontanous Breathing and Patient connected to face mask oxygen  Post-op Assessment: Report given to RN and Post -op Vital signs reviewed and stable  Post vital signs: Reviewed and stable  Last Vitals:  Vitals Value Taken Time  BP 131/65 01/05/20 1356  Temp    Pulse 110 01/05/20 1357  Resp 24 01/05/20 1357  SpO2 96 % 01/05/20 1357  Vitals shown include unvalidated device data.  Last Pain:  Vitals:   01/05/20 1000  TempSrc:   PainSc: 3       Patients Stated Pain Goal: 3 (01/05/20 1000)  Complications: No complications documented.

## 2020-01-05 NOTE — Op Note (Addendum)
OPERATIVE NOTE  Christine Hanson Date/Time of Admission: 01/05/2020  9:00 AM  CSN: [id number]:[id number] Attending Provider: Cheron Schaumann A, DO Room/Bed: MCPO/NONE DOB: 25-Jul-1975 Age: 44 y.o.   Pre-Op Diagnosis: Left chronic pansinusitis, bilateral inferior turbinate hypertrophy   Post-Op Diagnosis: Left chronic pansinusitis, bilateral inferior turbinate hypertrophy  Procedure: Procedure(s): LEFT ENDOSCOPIC SINUS SURGERY WITH NAVIGATION TOTAL ETHMOIDECTOMY, MAXILLARY ANTROSTOMY WITH TISSUE REMOVAL, SPHENOIDOTOMY, FRONTAL SINUSOTOMY WITH TISSUE REMOVAL, BILATERAL SUBMUCOUS RESECTION INFERIOR TURBINATES  Anesthesia: General  Surgeon(s): Emmarie Sannes A Indalecio Malmstrom, DO  Staff: Circulator: Rogers Seeds, RN Relief Circulator: Marcy Panning, RN Scrub Person: Carmela Rima Circulator Assistant: Woodroe Mode, RN  Implants: * No implants in log *  Specimens: ID Type Source Tests Collected by Time Destination  1 : LEFT SINUS CONTENTS Tissue PATH Sinus Contents/Nasal Polyps SURGICAL PATHOLOGY Brendolyn Stockley A, DO 01/05/2020 1321   A : Left maxillary sinus contents for cultures Nasopharyngeal Sinus Contents ANAEROBIC CULTURE, FUNGUS CULTURE WITH STAIN, AEROBIC CULTURE (SUPERFICIAL SPECIMEN) Awa Bachicha A, DO 01/05/2020 1242     Complications: None  EBL: 50 ML  Condition: stable  Operative Findings:  Polypoid change at left [facility], frank purulence in left maxillary sinus, inflammatory change throughout consistent with chronic infection, right caudal septal deviation  Description of Operation: Anesthesia and Prep -- After being properly identified in the preoperative holding area, the patient was brought into the operating suite. She was placed supine on the operating table. A pre-procedural time-out was performed. Pre-operative antibiotics and steroids were administered. After induction of general anesthesia, the patient was successfully intubated with  confirmation of tube placement via CO2 return. The bed was then turned 180 degrees. Eyes were taped closed. Pledgets soaked in 1:1000 fluorescein dyed epinephrine were placed in each nostril. The face was draped sterilely.  Image Guidance Registration and Prep -- The registration sticker for the [employer] image guidance system was placed on the forehead. The face was registered with good correlation. Landmarks were checked with the probe which showed satisfactory accuracy.   Left nasal endoscopy with maxillary antrostomy with removal of tissue -- 1% Lidocaine with 1:100,000 epinephrine was used to infiltrate the axilla of the middle turbinate, head of the middle turbinate and the sphenopalatine artery. The middle turbinate was medialized using a freer. The maxillary seeker was inserted and slipped behind the uncinate process. This was pulled anteriorly exposing the edge. A pediatric back-biter was inserted and the uncinate was cut anteriorly toward but not into the nasolacrimal duct. The microdebrider was used to remove the uncinate process above and below the cut made by the back-biter. A curved suction was inserted into the maxillary sinus through the antrostomy and pushed posteriorly and inferiorly to enlarge the opening. The microdebrider was used to clean up the edges of bone and mucosa. A 30 degree endoscope was then attached and used to view the sinus. The natural os was identified and the maxillary antrostomy was complete. There was frank purulence noted to be draining from the natural os, this was cultured.  There is no polypoid tissue within the maxillary sinus was removed using the microdebrider.  Left nasal endoscopy with total ethmoidectomy -- The 0 degree endoscope was reattached and the ethmoid bulla was identified.  A J-curette was slipped behind the bulla and gentle pressure was applied anteriorly to fracture the bulla. The microdebrider was taken and used to remove bone and mucosa opening the  bulla.  Removal extended laterally near the lamina papyracea and superiorly toward skull base. Ethmoid cells  were opened in succession using image guidance intermittently to verify location. The microdebrider was used to remove thin bone and clean up mucosal edges. All ethmoids air cells were opened.  Left nasal endoscopy with sphenoidotomy -- Using the microdebrider, the dissection was carried posteriorly through the vertical lamella and the superior turbinate was identified medially. The inferior one-third of the superior turbinate was removed with the microdebrider. Proceeding between the superior turbinate and the septum, the sphenoid ostium was easily identified. A J-currette was inserted and the shoulder of the instrument was pushed inferiorly and medially widening the ostium. A mushroom punch was inserted and used to widen the ostium.   Left nasal endoscopy with frontal sinusotomy and frontal sinus exploration -- 30 and 70 degree scopes were used to identify landmarks of the frontal recess. The CT scan was consulted to identify the drainage pattern. The sinus was entered through the natural ostium. Surrounding bone and mucosa was removed using the microdebrider. The frontal recess was opened in an anterior-posterior dimension from the frontal beak to the skull base and in a medial-lateral dimension from the middle turbinate to the lamina papyracea.  Bilateral submucous resection of inferior turbinates -1% lidocaine with 1: 100,000 epinephrine was injected into the head of the inferior turbinates bilaterally.  After allowing adequate time for vasoconstriction, an incision was made in the head of the inferior turbinate using a 15 blade.  A caudal elevator was then used to elevate the tissues from the bone, ensuring to stay in the submucous plane.  The microdebrider, using the turbinate blade, was then used to perform a submucous resection oof the turbinates bilaterally.  An elevator was then used to  outfracture the inferior turbinates bilaterally.   Conclusion -- Left nasal cavity inspected and no orbital fat or evidence of cerebral spinal fluid leak were observed. Both sides were irrigated and clot removed.   Nasopore was dipped in Ciprodex and was inserted lateral to the middle turbinate bilaterally.   A flexible suction catheter was used to remove fluid and blood from the oropharynx and hypopharynx.  A tongue depressor and Yankauer suction were used to examine the oropharynx and remove any fluid or blood, visualizing the posterior wall.  Steri strips were removed from the eyelids and the eyes were checked for tension or proptosis, none was observed. The image guidance sticker was removed. The patient's skin was cleaned. She was returned to the care of the anesthesia team. She was then weaned from the anesthetic and transported to the PACU in stable condition.    Laren Boom, DO [facility]  01/05/2020

## 2020-01-05 NOTE — H&P (Signed)
Sakina Nyliah Nierenberg is an 44 y.o. female.    Chief Complaint: Chronic sinusitis, nasal congestion  HPI: Patient presents today for planned surgical procedure.  She was was recently seen in our office on 12/01/2019.  At that time patient reported persistent symptoms of facial pressure and nasal congestion despite treatment with several courses of oral antibiotics and steroids.  She had a CT scan performed on 11/18/2019 which demonstrated complete opacification of the left frontal, maxillary, ethmoid sinuses with small caudal right septal deviation and bilateral inferior turbinate hypertrophy.   Past Medical History:  Diagnosis Date  . Asthma   . Eczema   . Hypertension   . Urticaria     History reviewed. No pertinent surgical history.  Family History  Problem Relation Age of Onset  . Allergic rhinitis Mother     Social History:  reports that she has quit smoking. She has never used smokeless tobacco. She reports that she does not drink alcohol and does not use drugs.  Allergies:  Allergies  Allergen Reactions  . Lamictal [Lamotrigine]     Severe migraine AND rash  . Penicillins Swelling  . Tramadol     Other reaction(s): Other Headache and nausea  . Clove Flavor   . Salmon Oil [Nutritional Supplements]     Stomach upset  . Topiramate Other (See Comments)    Cognitive fog  . Tuna Flavor     Medications Prior to Admission  Medication Sig Dispense Refill  . ALPRAZolam (XANAX) 0.5 MG tablet Take 0.5 mg by mouth at bedtime as needed for anxiety.    Marland Kitchen amphetamine-dextroamphetamine (ADDERALL) 10 MG tablet Take 10 mg by mouth 2 (two) times daily with a meal.    . b complex vitamins capsule Take 1 capsule by mouth daily.    Marland Kitchen buPROPion (WELLBUTRIN XL) 300 MG 24 hr tablet Take 300 mg by mouth daily.    . calcium carbonate (TUMS - DOSED IN MG ELEMENTAL CALCIUM) 500 MG chewable tablet Chew 1 tablet by mouth as needed for indigestion or heartburn.    . cetirizine (ZYRTEC) 10 MG  tablet Take 10 mg by mouth daily.    Marland Kitchen escitalopram (LEXAPRO) 10 MG tablet Take 10 mg by mouth daily.     . mometasone (NASONEX) 50 MCG/ACT nasal spray Place 2 sprays into the nose in the morning and at bedtime.    . montelukast (SINGULAIR) 10 MG tablet Take 10 mg by mouth at bedtime.    Marland Kitchen olopatadine (PATADAY) 0.1 % ophthalmic solution Place 1 drop into both eyes daily as needed for allergies.    Marland Kitchen omeprazole (PRILOSEC) 40 MG capsule Take 40 mg by mouth daily.    . predniSONE (DELTASONE) 20 MG tablet Take 40 mg by mouth See admin instructions. Taking every day for 5 days before the procedure    . rizatriptan (MAXALT-MLT) 10 MG disintegrating tablet Take 1 tablet (10 mg total) by mouth as needed for migraine (may repeat 2 hours after if needed). May repeat in 2 hours if needed 10 tablet 11  . sulfamethoxazole-trimethoprim (BACTRIM DS) 800-160 MG tablet Take 1 tablet by mouth 2 (two) times daily.    Marland Kitchen tiZANidine (ZANAFLEX) 4 MG tablet Take 4 mg by mouth every 6 (six) hours as needed for muscle spasms.    Marland Kitchen albuterol (VENTOLIN HFA) 108 (90 Base) MCG/ACT inhaler Inhale 2 puffs into the lungs every 6 (six) hours as needed for wheezing or shortness of breath.    . EPINEPHrine 0.3 mg/0.3 mL  IJ SOAJ injection Inject 0.3 mg into the muscle as needed for anaphylaxis.    . Fremanezumab-vfrm (AJOVY) 225 MG/1.5ML SOAJ Inject 225 mg into the skin every 30 (thirty) days. 1.5 mL 11    Results for orders placed or performed during the hospital encounter of 01/05/20 (from the past 48 hour(s))  Basic metabolic panel per protocol     Status: Abnormal   Collection Time: 01/05/20  9:28 AM  Result Value Ref Range   Sodium 137 135 - 145 mmol/L   Potassium 3.3 (L) 3.5 - 5.1 mmol/L   Chloride 99 98 - 111 mmol/L   CO2 25 22 - 32 mmol/L   Glucose, Bld 85 70 - 99 mg/dL    Comment: Glucose reference range applies only to samples taken after fasting for at least 8 hours.   BUN 10 6 - 20 mg/dL   Creatinine, Ser 3.14 0.44  - 1.00 mg/dL   Calcium 9.1 8.9 - 97.0 mg/dL   GFR, Estimated >26 >37 mL/min    Comment: (NOTE) Calculated using the CKD-EPI Creatinine Equation (2021)    Anion gap 13 5 - 15    Comment: Performed at [facility], [address], [place], [place] [address]  CBC per protocol     Status: Abnormal   Collection Time: 01/05/20  9:28 AM  Result Value Ref Range   WBC 14.6 (H) 4.0 - 10.5 K/uL   RBC 4.54 3.87 - 5.11 MIL/uL   Hemoglobin 12.3 12.0 - 15.0 g/dL   HCT 02.7 36 - 46 %   MCV 85.0 80.0 - 100.0 fL   MCH 27.1 26.0 - 34.0 pg   MCHC 31.9 30.0 - 36.0 g/dL   RDW 74.1 (H) 28.7 - 86.7 %   Platelets 191 150 - 400 K/uL    Comment: REPEATED TO VERIFY   nRBC 0.0 0.0 - 0.2 %    Comment: Performed at [facility], [address] [place], [place] [address]  Pregnancy, urine POC     Status: None   Collection Time: 01/05/20 10:26 AM  Result Value Ref Range   Preg Test, Ur NEGATIVE NEGATIVE    Comment:        THE SENSITIVITY OF THIS METHODOLOGY IS >24 mIU/mL    No results found.  ROS: ROS  Blood pressure 106/65, pulse 95, temperature 97.7 F (36.5 C), temperature source Temporal, resp. rate 18, height 5\' 2"  (1.575 m), weight 131.5 kg, last menstrual period 12/10/2019, SpO2 97 %.  PHYSICAL EXAM:  General: Well developed, well nourished obese female, no acute distress. Voice hoarse with no voice breaks  Head/Face: Normocephalic, atraumatic. No scars or lesions.  Eyes: Pupils are equal, round and reactive to light. Conjunctiva and lids are normal. Normal extraocular mobility.  Nose: No gross deformity or lesions. No purulent discharge. Caudal septum deviated to the right 2+, 2+ turbinate hypertrophy  Mouth/Oropharynx: Lips normal, teeth and gums normal with good dentition, normal oral vestibule. Normal floor of mouth, tongue and oral mucosa, no mucosal lesions, ulcer or mass, normal tongue mobility. Uvula midline. Hard and soft palate normal with normal mobility. 2+ tonsils, no  erythema or exudate.  Neck: Trachea midline. No masses. No crepitus.  Lymphatic: No lymphadenopathy in the neck.  Respiratory: No stridor or distress.  Extremities: No edema or cyanosis. Warm and well-perfused.  Neurologic: Alert and oriented to self, place and time. Normal reflexes and motor skills, balance and coordination. Moving all extremities without gross abnormality.  Psychiatric:  No unusual anxiety or evidence of depression. Appropriate affect.  Studies Reviewed: CT sinus demonstrates complete opacification of the left frontal, maxillary, ethmoid sinuses with mild mucosal inflammation of the sphenoid sinuses, as well as a small right caudal septal deviation and bilateral inferior turbinate hypertrophy   Assessment/Plan Randi Poullard is a 44 y.o. female with history of idiopathic intracranial hypertension, seasonal allergies, asthma, chronic sinusitis unresponsive to several courses of oral antibiotic therapy and steroids.  - Imaging demonstrates complete opacification of the left frontal, maxillary, ethmoid sinuses with mild mucosal inflammation of the sphenoid sinuses, as well as a small right caudal septal deviation and bilateral inferior turbinate hypertrophy. - To OR today for left functional endoscopic sinus surgery under image guidance with possible septoplasty and bilateral inferior turbinate reduction. Risks, benefits, and alternatives of the planned procedure were discussed as well as the expected postoperative course and recovery and the patient expressed understanding and agreement.Consent signed and placed in chart.   Merlyn Bollen A Henry Utsey 01/05/2020, 11:20 AM

## 2020-01-05 NOTE — Anesthesia Postprocedure Evaluation (Signed)
Anesthesia Post Note  Patient: [name]  Procedure(s) Performed: ENDOSCOPIC SINUS SURGERY WITH NAVIGATION (Left Nose) TOTAL ETHMOIDECTOMY, MAXILLARY ANTROSTOMY WITH STRIPPING SPHENOIDOTOMY, FRONTAL SINUSOTOMY WITH TISSUE REMOVAL (Left Nose) SINUS ENDO WITH FUSION (Left Nose)     Patient location during evaluation: PACU Anesthesia Type: General Level of consciousness: awake and alert Pain management: pain level controlled Vital Signs Assessment: post-procedure vital signs reviewed and stable Respiratory status: spontaneous breathing, nonlabored ventilation and respiratory function stable Cardiovascular status: blood pressure returned to baseline and stable Postop Assessment: no apparent nausea or vomiting Anesthetic complications: no   No complications documented.  Last Vitals:  Vitals:   01/05/20 1355 01/05/20 1411  BP: 131/65 136/78  Pulse: (!) 104 (!) 109  Resp: (!) 21 (!) 22  Temp: 37.2 C   SpO2: 94% 98%    Last Pain:  Vitals:   01/05/20 1355  TempSrc:   PainSc: Asleep                 Beryle Lathe

## 2020-01-05 NOTE — Discharge Instructions (Signed)
[facility] SINUS SURGERY (FESS) Post Operative Instructions  Office: [phone]  The Surgery Itself Endoscopic sinus surgery (with or without septoplasty and turbinate reduction) involves general anesthesia, typically for one to two hours. Patients may be sedated for several hours after surgery and may remain sleepy for the better part of the day. Nausea and vomiting are occasionally seen, and usually resolve by the evening of surgery - even without additional medications. Almost all patients can go home the day of surgery.  After Surgery  Facial pressure and fullness similar to a sinus infection/headache is normal after surgery. Breathing through your nose is also difficult due to swelling. A humidifier or vaporizer can be used in the bedroom to prevent throat pain with mouth breathing.   Bloody nasal drainage is normal after this surgery for 5-7 days, usually decreasing in volume with each day that passes. Drainage will flow from the front of the nose and down the back of the throat. Make sure you spit out blood drainage that drips down the back of your throat to prevent nausea/vomiting. You will have a nasal drip pad/sling with gauze to catch drainage from the front of your nose. The dressing may need to be changed frequently during the first 24 hours following surgery. In case of profuse nasal bleeding, you may apply ice to the bridge of the nose and pinch the nose just above the tip and hold for 10 minutes; if bleeding continues, contact the doctors office.   Frequent hot showers or saline nasal rinses (NeilMed) will help break up congestion and clear any clot or mucus that builds up within the nose after surgery. This can be started the day after surgery.   It is more comfortable to sleep with extra pillows or in a recliner for the first few days after surgery until the drainage begins to resolve.    Do not blow your nose for 2 weeks after surgery.   Avoid lifting > 10 lbs. and no  vigorous exercise for 2 weeks after Surgery.   Avoid airplane travel for 2 weeks following sinus surgery; the cabin pressure changes can cause pain and swelling within the nose/sinuses.   Sense of smell and taste are often diminished for several weeks after surgery. There may be some tenderness or numbness in your upper front teeth, which is normal after surgery. You may express old clot, discolored mucus or very large nasal crusts from your nose for up to 3-4 weeks after surgery; depending on how frequently and how effectively you irrigate your nose with the saltwater spray.   You may have absorbable sutures inside of your nose after surgery that will slowly dissolve in 2-3 weeks. Be careful when clearing crusts from the nose since they may be attached to these sutures.  Medications  Pain medication can be used for pain as prescribed. Pain and pressure in the nose is expected after surgery. As the surgical site heals, pain will resolve over the course of a week. Pain medications can cause nausea, which can be prevented if you take them with food or milk.   You may be given an antibiotic for one week after surgery to prevent infection. Take this medication with food to prevent nausea or vomiting.   You can use 2 nasal sprays after surgery: Afrin can be used up to 2 times a day for up to 5 days after surgery (best before bed) to reduce bloody drainage from the nose for the first few days after surgery. Saline/salt   water spray can be used as often as you would like starting the day after surgery to prevent crusting inside of the nose.   Take all of your routine medications as prescribed, unless told otherwise by your surgeon. Any medications that thin the blood should be avoided. This includes aspirin. Avoid aspirin-like products for the first 72 hours after surgery (Advil, Motrin, Excedrin, Alieve, Celebrex, Naprosyn), but you may use them as needed for pain after 72 hours.   

## 2020-01-05 NOTE — Anesthesia Preprocedure Evaluation (Addendum)
Anesthesia Evaluation  Patient identified by MRN, date of birth, ID band Patient awake    Reviewed: Allergy & Precautions, NPO status , Patient's Chart, lab work & pertinent test results  History of Anesthesia Complications Negative for: history of anesthetic complications  Airway Mallampati: II  TM Distance: >3 FB Neck ROM: Full    Dental  (+) Chipped, Dental Advisory Given   Pulmonary asthma , former smoker,  12/31/2019 SARS coronavirus NEG: has quarantined since   breath sounds clear to auscultation       Cardiovascular hypertension (is not on medication),  Rhythm:Regular Rate:Normal     Neuro/Psych  Headaches, Anxiety    GI/Hepatic Neg liver ROS, GERD  Medicated and Controlled,  Endo/Other  Morbid obesity  Renal/GU negative Renal ROS     Musculoskeletal  (+) Fibromyalgia -  Abdominal (+) + obese,   Peds  Hematology negative hematology ROS (+)   Anesthesia Other Findings   Reproductive/Obstetrics                            Anesthesia Physical Anesthesia Plan  ASA: III  Anesthesia Plan: General   Post-op Pain Management:    Induction: Intravenous  PONV Risk Score and Plan: 3 and Ondansetron, Dexamethasone and Scopolamine patch - Pre-op  Airway Management Planned: Oral ETT  Additional Equipment: None  Intra-op Plan:   Post-operative Plan: Extubation in OR  Informed Consent: I have reviewed the patients History and Physical, chart, labs and discussed the procedure including the risks, benefits and alternatives for the proposed anesthesia with the patient or authorized representative who has indicated his/her understanding and acceptance.     Dental advisory given  Plan Discussed with: CRNA and Surgeon  Anesthesia Plan Comments:         Anesthesia Quick Evaluation

## 2020-01-05 NOTE — Progress Notes (Signed)
Per Dr. Jean Rosenthal patient does not need COVID test today. Had PCR test at [facility] on Thursday and has been in quarantine since test.

## 2020-01-06 ENCOUNTER — Encounter (HOSPITAL_COMMUNITY): Payer: Self-pay | Admitting: Otolaryngology

## 2020-01-06 LAB — SURGICAL PATHOLOGY

## 2020-01-07 LAB — AEROBIC CULTURE W GRAM STAIN (SUPERFICIAL SPECIMEN): Gram Stain: NONE SEEN

## 2020-01-17 LAB — ANAEROBIC CULTURE: Gram Stain: NONE SEEN

## 2020-01-28 ENCOUNTER — Ambulatory Visit: Payer: Medicare Other | Admitting: Neurology

## 2020-02-03 ENCOUNTER — Telehealth: Payer: Self-pay | Admitting: Neurology

## 2020-02-03 LAB — FUNGAL ORGANISM REFLEX

## 2020-02-03 LAB — FUNGUS CULTURE WITH STAIN

## 2020-02-03 LAB — FUNGUS CULTURE RESULT

## 2020-02-03 NOTE — Telephone Encounter (Signed)
..   Pt understands that although there may be some limitations with this type of visit, we will take all precautions to reduce any security or privacy concerns.  Pt understands that this will be treated like an in office visit and we will file with pt's insurance, and there may be a patient responsible charge related to this service. ? ?

## 2020-02-03 NOTE — Progress Notes (Signed)
  Virtual Visit via Video Note  I connected with Christine Hanson on 02/03/20 at  3:15 PM EST by a video enabled telemedicine application and verified that I am speaking with the correct person using two identifiers.  Location: Patient: at her home Provider: in the office   I discussed the limitations of evaluation and management by telemedicine and the availability of in person appointments. The patient expressed understanding and agreed to proceed.  History of Present Illness: Christine Hansonis a 45-year-old female, seen in refer by vocational rehabilitation service for evaluation of possible intracranial hypertension, initial evaluation was on February 14, 2017.  She recently moved from [place] to [place] in November 2018, she is here to establish her care, she was diagnosed with intracranial hypertension since 2005, also fibromyalgia, was on disability,  Per patient, who presented with frequent headaches, decreased peripheral visual field, there was CSF leaking from her ears, diagnosis of intracranial hypertension was confirmed by repetitive lumbar puncture, she has multiple hospital admission, repeat lumbar puncture 4-5 times each year initially, she was also treated with Diamox 500 mg daily, eventually was tapered of around 2017, because of her stable symptoms, no significant visual complaints anymore.  She was also treated with Lyrica up to 900 mg daily carried a diagnosis of fibromyalgia, she suffered morbid obesity, is seen by the metabolic clinic, with her diet control, she has 30 weight loss over the past few years.  She now complains of frequent headaches, bilateral frontal, temporal region severe pounding headaches with associated light noise sensitivity, nauseous, for that reason, she is taking daily Sudafed, allergy pill, herb teas have helped her headache some  UPDATE April 01 2017: Laboratory evaluations in January 2019 showed normal CPK, TSH, ESR,  ANA, vitamin D, B12, CMP, CBC -reactive protein was mildly elevated at 14.7,  She was seen by behavior health, was started on wellbutrin 400mg daily with diagnosis of depression. She was treated with lamotrigine, she developed rash. Previously Topamax 150mg qhs, she felt stuttering, could nto rember her wors.   I have given her a prescription of Lyrica, Maxalt as needed, and Topamax prescription at last visit in January 2019, she did not feel any of the prescription, continue complaints of frequent headaches, seem to correlate with his right ear draining,  I also filledthe vocational rehabilitation paperwork for her today,  UPDATE August 26 2018: She continue has frequent headaches, weight has been stable, reported normal recent ophthalmology evaluation by Dr. Noble showed no evidence of papillary edema, I personally reviewed MRI of brain in April 2019, partial empty sella, no acute intracranial abnormality  She was not able to tolerate Topamax, complains of mental slowing, she has stopped Effexor, it was not helping her headache, she now complains 3-4 headaches each week, holoacranial severe pounding headache with associated light noise sensitivity, extending to her neck, lasting hours to days, she has tried Maxalt used to work well for her, no less effective also cause excessive drowsiness  Update October 13, 2018 SS: Virtual visit, she says she has intracranial HTN, and migraines. No other symptoms associated with intracranial HTN. She got new glasses, that has helped with her headaches. She is under stress with work and family. Last 1 month, her work has said she exceeded her FMLA. Her job is on the computer and on the phone. Hasn't been taking propranolol. The nortriptyline has been helpful, is sleeping better. She is having 3 migraines a week, they are less intense. Imitrex does not fully   relieve her headache. Her typical headache, wakes up in the morning once a week, the others feel  pressure in her head. She doesn't have visual disturbance. She saw eye doctor 3 weeks, Dr. Tamala Julian at [facility] care, no papilledema  Update 3/4/2021SS:She is no longer taking propanolol, she never started nortriptyline. She complains of daily headache, about 3 significant headaches a week. The headaches are in the occipital area, bifrontally, with significant headache, photophobia, nausea. When the headache is significant, will be stabbing pain, usually just dull ache. She has history of ICH,but says these headaches are different, currently no visual disturbance,or narrowing of vision, or gait instability, with ICHheadaches, feels her head is too heavy for her neck.She has been having some drainage from her ears, eyes and nose,but is having allergy testing tomorrow, thinks more related toallergies?She does not want LP. Does mentionif she may need sleep study?  Update Jun 18, 2019 KG:YJEHUDJSH much better, seemed to be related to allergies she feels, had significant sinus infection. She saw allergist. Alford Highland once,it was helpful, did not refill it. She has been taking propanolol in the morning on as-needed basis. Indicates waking up with morning headache at least 3 days a week. Since last seen, she has had 4 significant headaches, took Maxalt,had excellent benefit, but was drowsy after. Reports withsignificant headache, has feeling of nausea that precedes head pain. Denies visual disturbance, has not seen ophthalmology recently. Indicates she does not sleep well at night, wakes up frequently, does report snoring,daytime fatigue, drowsiness, questions need for possible sleep study. Presents today for follow-up via virtual visit.  Update September 24, 2019 SS: Migraines overall improved, never picked up the Ajovy, only did 1 injection a few months ago.  On average, 2 significant migraines a month, excellent benefit with Maxalt.  Continues to report 4/7 days of morning headache, has  snoring, daytime drowsiness, fatigue, referred for sleep study, they called, she didn't call back.  Due for eye exam, missed her appointment.  History of IIH, could not tolerate Diamox or Topamax due to reported side effects, headaches currently not consistent with IIH, does have some occasional blurring vision.  Weight is stable, seeing psychiatrist for depression, just put on Lexapro.  Works for [name].  Update February 04, 2020 SS: VV today. Feeling headache more in the morning, waking with morning headache. Still on Ajovy, initially the headaches slowed down. Had sinus surgery recently. Migraine headache:is unilateral, aching in eyes, nausea. IIH headache: sometimes nausea, usually worse when lying down, will ease when she is up, even if takes headache medication, may lessen but doesn't take away, feels like head is too heavy for neck. 2-3 a month migraines. Feels almost daily IIH h/a, could be anytime day or night. No vision changes, occasionally sees spots, no narrowing of visual field. Has gained weight 15 lbs, last few months antibiotics, steroids-after sinus surgery. Doesn't want LP right now at all. Retry Diamox, couldn't tolerate Topamax   Observations/Objective:  Via VV, is alert and oriented, speech is clear and concise, facial symmetry noted, good historian, follows commands  Assessment and Plan: 1.  History of pseudotumor cerebri 2.  Frequent headaches with migraine features -Reported more frequent headache with IIH characteristics -Again referred to ophthalmology, Dr. Katy Fitch for evaluation of papilledema with history of pseudotumor -Add in Diamox, previously had side effect of paresthesia, start low dose 250 mg twice daily, previously could not tolerate Topamax even low-dose -Adamant doesn't want LP at this time, but I told her may need in  future, for now no visual disturbance, needs to focus on weight loss, weight is up 15 lbs  -Continue Ajovy 225 mg monthly injection for migraine  prevention -Continue Maxalt as needed for acute headache -Concern for potential OSA, previously referred for sleep evaluation, did not follow through with referral  Follow Up Instructions: 4-6 weeks with Dr. Krista Blue for revisit    I discussed the assessment and treatment plan with the patient. The patient was provided an opportunity to ask questions and all were answered. The patient agreed with the plan and demonstrated an understanding of the instructions.   The patient was advised to call back or seek an in-person evaluation if the symptoms worsen or if the condition fails to improve as anticipated.  I spent 20 minutes of face-to-face and non-face-to-face time with patient.  This included previsit chart review, lab review, study review, order entry, electronic health record documentation, patient education.  Evangeline Dakin, DNP  [facility] [address], [place], [place] [address] [phone]

## 2020-02-03 NOTE — Telephone Encounter (Signed)
Noted  

## 2020-02-04 ENCOUNTER — Telehealth (INDEPENDENT_AMBULATORY_CARE_PROVIDER_SITE_OTHER): Payer: BC Managed Care – PPO | Admitting: Neurology

## 2020-02-04 ENCOUNTER — Encounter: Payer: Self-pay | Admitting: Neurology

## 2020-02-04 DIAGNOSIS — G43709 Chronic migraine without aura, not intractable, without status migrainosus: Secondary | ICD-10-CM

## 2020-02-04 DIAGNOSIS — G932 Benign intracranial hypertension: Secondary | ICD-10-CM | POA: Diagnosis not present

## 2020-02-04 MED ORDER — ACETAZOLAMIDE 250 MG PO TABS: 250.0000 mg | ORAL_TABLET | Freq: Two times a day (BID) | ORAL | 3 refills | Status: DC

## 2020-02-17 ENCOUNTER — Other Ambulatory Visit: Payer: Self-pay

## 2020-02-17 MED ORDER — ACETAZOLAMIDE 250 MG PO TABS: 250.0000 mg | ORAL_TABLET | Freq: Two times a day (BID) | ORAL | 0 refills | Status: DC

## 2020-03-14 ENCOUNTER — Encounter: Payer: Self-pay | Admitting: Neurology

## 2020-03-17 ENCOUNTER — Ambulatory Visit: Payer: Medicare Other | Admitting: Neurology

## 2020-04-27 ENCOUNTER — Encounter: Payer: Self-pay | Admitting: *Deleted

## 2020-05-02 ENCOUNTER — Other Ambulatory Visit: Payer: Self-pay | Admitting: *Deleted

## 2020-05-02 DIAGNOSIS — G43709 Chronic migraine without aura, not intractable, without status migrainosus: Secondary | ICD-10-CM

## 2020-05-02 MED ORDER — AJOVY 225 MG/1.5ML ~~LOC~~ SOAJ: 225.0000 mg | SUBCUTANEOUS | 3 refills | Status: DC

## 2020-05-02 MED ORDER — RIZATRIPTAN BENZOATE 10 MG PO TBDP: 10.0000 mg | ORAL_TABLET | ORAL | 11 refills | Status: DC | PRN

## 2020-05-12 ENCOUNTER — Encounter: Payer: Self-pay | Admitting: Neurology

## 2020-05-12 ENCOUNTER — Telehealth: Payer: Self-pay | Admitting: *Deleted

## 2020-05-12 ENCOUNTER — Ambulatory Visit: Payer: Medicare Other | Admitting: Neurology

## 2020-05-12 NOTE — Telephone Encounter (Signed)
No showed follow up appointment. 

## 2020-06-29 ENCOUNTER — Telehealth: Payer: Self-pay | Admitting: Neurology

## 2020-06-29 NOTE — Telephone Encounter (Signed)
Keep appointment with Dr. Terrace Arabia tomorrow. Needs to review her medications and why she isn't taking them. She could possibly have injection tomorrow or nerve block.

## 2020-06-29 NOTE — Telephone Encounter (Signed)
Pt is asking if there is anything else that she can use other than her rizatriptan (MAXALT-MLT) 10 MG disintegrating tablet for a 3 day migraine.  Pt is aware of her appointment on tomorrow, please call

## 2020-06-29 NOTE — Telephone Encounter (Addendum)
I called patient.  I relayed Sarah's message.  Nothing for now keep appointment with Dr. Terrace Arabia could possible do injection or nerve block but of course it is dependent on Dr. Zannie Cove recommendation tomorrow.  Stated okay will come see at appointment tomorrow

## 2020-06-29 NOTE — Telephone Encounter (Signed)
I called patient she has had a migraine since Sunday Maxalt seems to help some she only takes 1 tablet has not tried 2 tablets in a day.  She has had to leave work Tuesday because she had gotten no better.   She has had some nausea today her headache level is 8. She has not been on Diamox for like 2 weeks due to having chest pain and low potassium. Also has not been on the Ajovy for the last 2 months.  She does have an appointment with Dr. Terrace Arabia tomorrow.  She is asking for if there is anything that we get give her today to get her through until that appointment. she is okay for Toradol injection or infusion.  She has not been here for infusion.

## 2020-06-30 ENCOUNTER — Encounter: Payer: Self-pay | Admitting: Neurology

## 2020-06-30 ENCOUNTER — Telehealth: Payer: Self-pay | Admitting: *Deleted

## 2020-06-30 ENCOUNTER — Other Ambulatory Visit: Payer: Self-pay

## 2020-06-30 ENCOUNTER — Ambulatory Visit (INDEPENDENT_AMBULATORY_CARE_PROVIDER_SITE_OTHER): Payer: BC Managed Care – PPO | Admitting: Neurology

## 2020-06-30 VITALS — BP 128/82 | HR 92 | Ht 62.0 in | Wt 302.0 lb

## 2020-06-30 DIAGNOSIS — Z8669 Personal history of other diseases of the nervous system and sense organs: Secondary | ICD-10-CM | POA: Diagnosis not present

## 2020-06-30 DIAGNOSIS — T3995XA Adverse effect of unspecified nonopioid analgesic, antipyretic and antirheumatic, initial encounter: Secondary | ICD-10-CM | POA: Insufficient documentation

## 2020-06-30 DIAGNOSIS — G4440 Drug-induced headache, not elsewhere classified, not intractable: Secondary | ICD-10-CM

## 2020-06-30 DIAGNOSIS — G43709 Chronic migraine without aura, not intractable, without status migrainosus: Secondary | ICD-10-CM

## 2020-06-30 MED ORDER — AJOVY 225 MG/1.5ML ~~LOC~~ SOAJ: 225.0000 mg | SUBCUTANEOUS | 11 refills | Status: DC

## 2020-06-30 MED ORDER — ONDANSETRON 4 MG PO TBDP: 4.0000 mg | ORAL_TABLET | Freq: Three times a day (TID) | ORAL | 6 refills | Status: DC | PRN

## 2020-06-30 MED ORDER — SUMATRIPTAN SUCCINATE 100 MG PO TABS: 100.0000 mg | ORAL_TABLET | Freq: Once | ORAL | 6 refills | Status: DC | PRN

## 2020-06-30 MED ORDER — ZONISAMIDE 50 MG PO CAPS: 100.0000 mg | ORAL_CAPSULE | Freq: Every day | ORAL | 11 refills | Status: DC

## 2020-06-30 NOTE — Patient Instructions (Addendum)
At the beginning of the headache, you can take  Imitrex 100 mg tablets Zofran 4 mg as needed for nausea Tizanidine 4 mg as needed for muscle relaxant May also combine it with Aleve 1 to 2 tablets If you can sleep with always help  Christine Hanson all of the medications he will come see [place] help

## 2020-06-30 NOTE — Telephone Encounter (Signed)
[id number] approved through 06/30/2021.

## 2020-06-30 NOTE — Telephone Encounter (Signed)
PA for Ajovy 225mg  started on covermymeds (key: [id number]). Pt has coverage through [employer] [id number]).

## 2020-06-30 NOTE — Progress Notes (Signed)
Chief Complaint  Patient presents with  . Follow-up    She is having frequent headaches, varying in severity. Her current headache has been persistent for the last four days. She has stopped Diamox and Ajovy. Declined her last offered LP. Rizatiptan helps some but does not stop the pain.       ASSESSMENT AND PLAN  Christine Hanson is a 45 y.o. female   Reported a history of pseudotumor cerebri  For patient, most recent ophthalmology evaluation was in 2020, there was no evidence of bilateral papillary edema  Continued steady weight gain over the past few years  Could not tolerate Diamox, paresthesia, feel like ants crawling on for her face, Topamax, mental slowing  Advised patient keep her ophthalmology evaluation, planned in July 2022  Worsening migraine headaches  She has been using daily Excedrin Migraine, certainly component of medicine rebound headache  Responded well to Ajovy, but has not been compliant with her medications, new prescription was called in  Start zonisamide 50 mg, 2 tablets every night as migraine prevention  Also on polypharmacy, Wellbutrin, Lexapro  Maxalt 10 mg dissolvable, suboptimal response, did not like the preparation, will try Imitrex 100 mg as needed, may mix with Zofran, and tizanidine,  IV infusion of Depacon 500 mg twice plus IV Toradol 30 mg, and Zofran for acute rescue treatment today  Return to clinic in 4 months   DIAGNOSTIC DATA (LABS, IMAGING, TESTING) - I reviewed patient records, labs, notes, testing and imaging myself where available.  MRI of brain on April 26 2017: 1.    There are a couple small T2/FLAIR hyperintense foci in the hemispheres consistent with minimal chronic microvascular ischemic change or the sequela of prior migraine or inflammation.  None of these appear to be acute. 2.    Chronic left maxillary sinusitis. 3.    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  This is usually an incidental finding without significance but can  also be seen in the setting of elevated intracranial pressure.   4.    Multiple subcutaneous cysts, some as large as 24 mm.  These are most likely sebaceous cysts though other cystic structures cannot be ruled out.  If painful or changing in size, consider resection.  Laboratory evaluation in 2022, normal CMP with exception of slightly decreased potassium 3.6, CBC with hemoglobin of 10.9, decreased MCV, MCH, MCHC, elevated RDW  HISTORICAL Christine Ocasio-Atkinsis a 45 year old female, seen in refer by vocational rehabilitation service for evaluation of possible intracranial hypertension, initial evaluation was on February 14, 2017.  She recently moved from [place] to [place] in November 2018, she is here to establish her care, she was diagnosed with intracranial hypertension since 2005, also fibromyalgia, was on disability,  Per patient, who presented with frequent headaches, decreased peripheral visual field, there was CSF leaking from her ears, diagnosis of intracranial hypertension was confirmed by repetitive lumbar puncture, she has multiple hospital admission, repeat lumbar puncture 4-5 times each year initially, she was also treated with Diamox 500 mg daily, eventually was tapered of around 2017, because of her stable symptoms, no significant visual complaints anymore.  She was also treated with Lyrica up to 900 mg daily carried a diagnosis of fibromyalgia, she suffered morbid obesity, is seen by the metabolic clinic, with her diet control, she has 30 weight loss over the past few years.  She now complains of frequent headaches, bilateral frontal, temporal region severe pounding headaches with associated light noise sensitivity, nauseous, for that  reason, she is taking daily Sudafed, allergy pill, herb teas have helped her headache some  UPDATE April 01 2017: Laboratory evaluations in January 2019 showed normal CPK, TSH, ESR, ANA, vitamin D, B12, CMP, CBC -reactive protein was  mildly elevated at 14.7,  She was seen by behavior health, was started on wellbutrin 400mg  daily with diagnosis of depression. She was treated with lamotrigine, she developed rash. Previously Topamax 150mg  qhs, she felt stuttering, could nto rember her words.   I have given her a prescription of Lyrica, Maxalt as needed, and Topamax prescription at last visit in January 2019, she did not fill any of the prescription, continue complaints of frequent headaches, seem to correlate with his right ear draining,  I also filledthe vocational rehabilitation paperwork for her today,  UPDATE August 26 2018: She continue has frequent headaches, weight has been stable, reported normal recent ophthalmology evaluation by Dr. Herschel Senegal showed no evidence of papillary edema, I personally reviewed MRI of brain in April 2019, partial empty sella, no acute intracranial abnormality  She was not able to tolerate Topamax, complains of mental slowing, she has stopped Effexor, it was not helping her headache, she now complains 3-4 headaches each week, holoacranial severe pounding headache with associated light noise sensitivity, extending to her neck, lasting hours to days, she has tried Maxalt used to work well for her, no less effective also cause excessive drowsiness  UPDATE June 30 2020: Previous visit was by Judson Roch, majority are virtual visit during pandemic, patient has not been compliant with her medications, could not tolerate Topamax due to mental slowing, Diamox, feels like ants crossing her face  In addition, she has been taking daily Excedrin Migraine, or in combination of Aleve, Tylenol for her mild headache, about 2-3 times each week, she will get more severe holoacranial pounding headache with light noise sensitivity, nauseous, she does not like the dissolvable preparation of Maxalt,  She has stopped her ajovy 70-month ago, despite reporting significant improvement with ajovy, she has also missed multiple  ophthalmology evaluation  She continues to deal with depression, anxiety, on polypharmacy Wellbutrin, Lexapro, complains of difficulty sleeping  Over the past 2 years, she has steady weight gain of 40 pounds,  She called for today earlier than expected in distress, complains of 5 days history of moderate to severe vortex headache, light noise sensitivity, failed daily Maxalt, Excedrin Migraine, Aleve, Tylenol treatments  She denies visual change, describes this headache is different from her previous pseudotumor headache, when her headache is often getting worse by lying down, this particular headache, she feels better lying down with her eyes closed.    PHYSICAL EXAM:   Vitals:   06/30/20 0724  Weight: (!) 302 lb (137 kg)  Height: 5\' 2"  (1.575 m)    Body mass index is 55.24 kg/m.  PHYSICAL EXAMNIATION:  Gen: NAD, conversant, well nourised, well groomed                     Cardiovascular: Regular rate rhythm, no peripheral edema, warm, nontender. Eyes: Conjunctivae clear without exudates or hemorrhage Neck: Supple, no carotid bruits. Pulmonary: Clear to auscultation bilaterally   NEUROLOGICAL EXAM:  MENTAL STATUS: Distressed looking middle-aged female Speech:    Speech is normal; fluent and spontaneous with normal comprehension.  Cognition:     Orientation to time, place and person     Normal recent and remote memory     Normal Attention span and concentration     Normal Language, naming,  repeating,spontaneous speech     Fund of knowledge   CRANIAL NERVES: CN II: Visual fields are full to confrontation. Pupils are round equal and briskly reactive to light. CN III, IV, VI: extraocular movement are normal. No ptosis. CN V: Facial sensation is intact to light touch CN VII: Face is symmetric with normal eye closure  CN VIII: Hearing is normal to causal conversation. CN IX, X: Phonation is normal. CN XI: Head turning and shoulder shrug are intact  MOTOR: There is no  pronator drift of out-stretched arms. Muscle bulk and tone are normal. Muscle strength is normal.  REFLEXES: Reflexes are 2+ and symmetric at the biceps, triceps, knees, and ankles. Plantar responses are flexor.  SENSORY: Intact to light touch, pinprick and vibratory sensation are intact in fingers and toes.  COORDINATION: There is no trunk or limb dysmetria noted.  GAIT/STANCE: Need push-up to get up from seated position, limited due to her big body habitus  REVIEW OF SYSTEMS:  Full 14 system review of systems performed and notable only for as above All other review of systems were negative.   ALLERGIES: Allergies  Allergen Reactions  . Lamictal [Lamotrigine]     Severe migraine AND rash  . Penicillins Swelling  . Tramadol     Other reaction(s): Other Headache and nausea  . Clove Flavor   . Salmon Oil [Nutritional Supplements]     Stomach upset  . Topiramate Other (See Comments)    Cognitive fog  . Tuna Flavor     HOME MEDICATIONS: Current Outpatient Medications  Medication Sig Dispense Refill  . acetaZOLAMIDE (DIAMOX) 250 MG tablet Take 1 tablet (250 mg total) by mouth 2 (two) times daily. 180 tablet 0  . albuterol (VENTOLIN HFA) 108 (90 Base) MCG/ACT inhaler Inhale 2 puffs into the lungs every 6 (six) hours as needed for wheezing or shortness of breath.    . ALPRAZolam (XANAX) 0.5 MG tablet Take 0.5 mg by mouth at bedtime as needed for anxiety.    Marland Kitchen amphetamine-dextroamphetamine (ADDERALL) 10 MG tablet Take 10 mg by mouth 2 (two) times daily with a meal.    . b complex vitamins capsule Take 1 capsule by mouth daily.    Marland Kitchen buPROPion (WELLBUTRIN XL) 300 MG 24 hr tablet Take 300 mg by mouth daily.    . calcium carbonate (TUMS - DOSED IN MG ELEMENTAL CALCIUM) 500 MG chewable tablet Chew 1 tablet by mouth as needed for indigestion or heartburn.    . cetirizine (ZYRTEC) 10 MG tablet Take 10 mg by mouth daily.    Marland Kitchen EPINEPHrine 0.3 mg/0.3 mL IJ SOAJ injection Inject 0.3 mg  into the muscle as needed for anaphylaxis.    Marland Kitchen escitalopram (LEXAPRO) 10 MG tablet Take 10 mg by mouth daily.     . Fremanezumab-vfrm (AJOVY) 225 MG/1.5ML SOAJ Inject 225 mg into the skin every 30 (thirty) days. 4.5 mL 3  . mometasone (NASONEX) 50 MCG/ACT nasal spray Place 2 sprays into the nose in the morning and at bedtime.    . montelukast (SINGULAIR) 10 MG tablet Take 10 mg by mouth at bedtime.    Marland Kitchen olopatadine (PATADAY) 0.1 % ophthalmic solution Place 1 drop into both eyes daily as needed for allergies.    Marland Kitchen omeprazole (PRILOSEC) 40 MG capsule Take 40 mg by mouth daily.    . predniSONE (DELTASONE) 20 MG tablet Take 40 mg by mouth See admin instructions. Taking every day for 5 days before the procedure    . rizatriptan (  MAXALT-MLT) 10 MG disintegrating tablet Take 1 tablet (10 mg total) by mouth as needed for migraine (may repeat 2 hours after if needed). May repeat in 2 hours if needed 10 tablet 11  . sodium chloride (OCEAN) 0.65 % SOLN nasal spray Place 4 sprays into both nostrils every 4 (four) hours. 50 mL 0  . tiZANidine (ZANAFLEX) 4 MG tablet Take 4 mg by mouth every 6 (six) hours as needed for muscle spasms.     No current facility-administered medications for this visit.    PAST MEDICAL HISTORY: Past Medical History:  Diagnosis Date  . Asthma   . Eczema   . Hypertension   . Urticaria     PAST SURGICAL HISTORY: Past Surgical History:  Procedure Laterality Date  . SEPTOPLASTY WITH ETHMOIDECTOMY, AND MAXILLARY ANTROSTOMY Left 01/05/2020   Procedure: TOTAL ETHMOIDECTOMY, MAXILLARY ANTROSTOMY WITH STRIPPING SPHENOIDOTOMY, FRONTAL SINUSOTOMY WITH TISSUE REMOVAL;  Surgeon: Jason Coop, DO;  Location: [facility];  Service: ENT;  Laterality: Left;  . SINUS ENDO W/FUSION Left 01/05/2020   Procedure: ENDOSCOPIC SINUS SURGERY WITH NAVIGATION;  Surgeon: Skotnicki, Meghan A, DO;  Location: [facility];  Service: ENT;  Laterality: Left;  . SINUS ENDO WITH FUSION Left 01/05/2020   Procedure:  SINUS ENDO WITH FUSION;  Surgeon: Jason Coop, DO;  Location: [facility];  Service: ENT;  Laterality: Left;    FAMILY HISTORY: Family History  Problem Relation Age of Onset  . Allergic rhinitis Mother     SOCIAL HISTORY: Social History   Socioeconomic History  . Marital status: Significant Other    Spouse name: Not on file  . Number of children: Not on file  . Years of education: Not on file  . Highest education level: Not on file  Occupational History  . Not on file  Tobacco Use  . Smoking status: Former Research scientist (life sciences)  . Smokeless tobacco: Never Used  Substance and Sexual Activity  . Alcohol use: No  . Drug use: No  . Sexual activity: Not on file  Other Topics Concern  . Not on file  Social History Narrative  . Not on file   Social Determinants of Health   Financial Resource Strain: Not on file  Food Insecurity: Not on file  Transportation Needs: Not on file  Physical Activity: Not on file  Stress: Not on file  Social Connections: Not on file  Intimate Partner Violence: Not on file      Marcial Pacas, M.D. Ph.D.  [facility] [address] [place], [place] [address] Ph: [phone] Fax: [phone]  CC:  Willeen Niece, [place],  [place] [id number]  Willeen Niece, [place]

## 2020-07-06 ENCOUNTER — Telehealth: Payer: Self-pay | Admitting: Neurology

## 2020-07-06 ENCOUNTER — Encounter: Payer: Self-pay | Admitting: *Deleted

## 2020-07-06 NOTE — Telephone Encounter (Signed)
Pt called, since starting zonisamide (ZONEGRAN) 50 MG capsule have been having nausea, diarrhea, stomach pain, sweats, chills. Would like a call from the nurse.

## 2020-07-06 NOTE — Telephone Encounter (Signed)
I spoke to the patient. She started zonasamide 50mg , two tablets at bedtime four days ago. Reports upset stomach (nausea, diarrhea), dizziness and chills. Denies any fever, cough, vomiting or other signs of infection. She took a home Covid test as a precaution and it was negative. She is also still having an active migraine and trouble differentiating the cause of symptoms. The migraine is much less severe after having an infusion in our office last week. She has not tried her home meds yet for pain relief.  I instructed her to try her rescue medications to rid the residual migraine. She will also try to reduce the zonasamide 50mg  to one tablet at bedtime to see if this will help. She should increase back up to two tablets at bedtime in one week. If she continues to feel bad, I told her to please call back and we will discuss further options.  She is agreeable to this plan.

## 2020-07-07 ENCOUNTER — Other Ambulatory Visit: Payer: Self-pay | Admitting: *Deleted

## 2020-07-07 MED ORDER — SUMATRIPTAN SUCCINATE 100 MG PO TABS: ORAL_TABLET | ORAL | 1 refills | Status: DC

## 2020-07-11 ENCOUNTER — Telehealth: Payer: Self-pay | Admitting: Neurology

## 2020-07-11 NOTE — Telephone Encounter (Signed)
Spoke with patient and informed her DR [facility] stated to stop zonisamide, let symptoms resolve. I advised she call back in a few days and update Marcelino Duster, RN and Dr Terrace Arabia. Patient verbalized understanding, appreciation.

## 2020-07-11 NOTE — Telephone Encounter (Signed)
Pt called,  yesterday starting having reaction to Fremanezumab-vfrm (AJOVY) 225 MG/1.5ML SOAJ also still having reaction to zonisamide (ZONEGRAN) 50 MG capsule  Would like a call from the nurse

## 2020-07-11 NOTE — Telephone Encounter (Signed)
Per Nadeen Landau, she reduced zonisamide to 50 mg at night on 07/06/20. She reports less dizziness and wooziness now but not gone away completely. She took Ajovy on 07/07/20 at night, and .Sun morning she began feeling sort of like anxiety attack, heart racing, and shakiness. She stated she' s still experiencing these symptoms today, stayed out of work.  Advised Dr Terrace Arabia is out of office, will discuss with work in [place], call her back. Patient verbalized understanding, appreciation.

## 2020-07-12 NOTE — Telephone Encounter (Signed)
I spoke to the patient who is still feeling the same. She did not take the zonisamide last night. She is aware that it may take a few days to get completely out of her system. I encouraged her to increase her water intake. I asked her to call me back by Thursday to provide an update.

## 2020-07-12 NOTE — Telephone Encounter (Signed)
Pt called, Still having the side effects from the medication. Would like to speak with the nurse.

## 2020-07-12 NOTE — Addendum Note (Signed)
Addended by: Lindell Spar C on: 07/12/2020 04:42 PM   Modules accepted: Orders

## 2020-07-13 ENCOUNTER — Telehealth: Payer: Self-pay | Admitting: *Deleted

## 2020-07-13 NOTE — Telephone Encounter (Signed)
Pt [name] form faxed on 07/13/20

## 2020-07-18 NOTE — Telephone Encounter (Signed)
Pt called, still having headaches, body aches. Have ran out ondansetron (ZOFRAN ODT) 4 MG disintegrating tablet Do not know if should get a refill. Would like a call from the nurse.

## 2020-07-18 NOTE — Telephone Encounter (Signed)
I spoke to the patient and she provided me with an update. She started feeling much worse over the weekend. Fever (ranging from 100.6 to 104), chills, nausea, congestion, body aches, headache. Negative home Covid test. She has a video visit with her PCP today at 1:30pm to address these symptoms.

## 2020-08-04 ENCOUNTER — Ambulatory Visit: Payer: Medicare Other | Admitting: Neurology

## 2020-08-22 ENCOUNTER — Telehealth: Payer: Self-pay | Admitting: Neurology

## 2020-08-22 DIAGNOSIS — G932 Benign intracranial hypertension: Secondary | ICD-10-CM

## 2020-08-22 DIAGNOSIS — Z8669 Personal history of other diseases of the nervous system and sense organs: Secondary | ICD-10-CM

## 2020-08-22 NOTE — Telephone Encounter (Signed)
Pt is asking for a call to discuss scheduling her Spinal tap, pt is asking it be noted that it needs to be done under [facility]

## 2020-08-22 NOTE — Telephone Encounter (Signed)
Chart reviewed, also personally reviewed MRI of the brain in 2019 no significant structural abnormality,  Recent ophthalmology evaluation on August 19, 2020 by Dr. Alden Hipp showed evidence of papillary edema,  Patient complains of increased headaches, on polypharmacy treatment, could not tolerate multiple preventive medication, including recent zonisamide, previous Diamox, Topamax,  Will refer patient for fluoroscopy guided lumbar puncture,  Orders Placed This Encounter  Procedures   DG FLUORO GUIDED LOC OF NEEDLE/CATH TIP FOR SPINAL INJECT LT

## 2020-08-22 NOTE — Telephone Encounter (Signed)
I have spoken to the patient and she is agreeable to proceed with the LP. She is aware to expect a call from [facility] to be scheduled.

## 2020-08-22 NOTE — Telephone Encounter (Signed)
Records received from [facility]. Placed on Dr. Zannie Cove desk for review.

## 2020-08-22 NOTE — Telephone Encounter (Addendum)
She was seen at [facility] on 08/18/20. 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 Dr. Terrace Arabia will need to review this exam prior to making a decision on the LP. We will make an urgent request for records.

## 2020-08-22 NOTE — Addendum Note (Signed)
Addended by: Levert Feinstein on: 08/22/2020 02:02 PM   Modules accepted: Orders

## 2020-09-02 ENCOUNTER — Other Ambulatory Visit: Payer: Self-pay

## 2020-09-02 ENCOUNTER — Ambulatory Visit
Admission: RE | Admit: 2020-09-02 | Discharge: 2020-09-02 | Disposition: A | Discharge status: Home / Self Care | Payer: BC Managed Care – PPO | Source: Ambulatory Visit | Attending: Neurology | Admitting: Neurology

## 2020-09-02 VITALS — BP 123/77 | HR 94

## 2020-09-02 DIAGNOSIS — R030 Elevated blood-pressure reading, without diagnosis of hypertension: Secondary | ICD-10-CM

## 2020-09-02 DIAGNOSIS — Z8669 Personal history of other diseases of the nervous system and sense organs: Secondary | ICD-10-CM

## 2020-09-02 DIAGNOSIS — G932 Benign intracranial hypertension: Secondary | ICD-10-CM

## 2020-09-02 MED ORDER — DIAZEPAM 5 MG PO TABS
10.0000 mg | ORAL_TABLET | Freq: Once | ORAL | Status: AC
  Administered 2020-09-02: 10 mg via ORAL

## 2020-09-02 NOTE — Discharge Instructions (Signed)

## 2020-09-05 ENCOUNTER — Telehealth: Payer: Self-pay | Admitting: Neurology

## 2020-09-05 ENCOUNTER — Other Ambulatory Visit: Payer: Self-pay | Admitting: Neurology

## 2020-09-05 DIAGNOSIS — G971 Other reaction to spinal and lumbar puncture: Secondary | ICD-10-CM

## 2020-09-05 MED ORDER — BUTALBITAL-APAP-CAFFEINE 50-325-40 MG PO TABS: ORAL_TABLET | ORAL | 0 refills | Status: DC

## 2020-09-05 NOTE — Telephone Encounter (Signed)
Returned call to patient.  She stated she is only taking Tylenol and alternating with Ibuprofen.  She had a spinal tap done on Friday.  She spoke with [facility] and that was their recommendation.  She said the Tylenol is helping, she is able to sit up for 30 minutes, then her head starts to hurt.  When she lies down, it goes away.  She doesn't think she needs a blood patch yet, as the headache is going away when she lays down but she isn't sure.  She thinks the headache isn't going away fast enough.  She is staying well hydrated.  She said that [facility] told her to contact us for further recommendations.  Also, if she needs to be seen by anyone here, can she do a video visit.  She can be contacted through MyChart.  Aware Dr. Terrace Arabia is out of the office today but will return tomorrow.  Wants the on call doctor to review this information and recommend something for her to feel better.

## 2020-09-05 NOTE — Telephone Encounter (Signed)
Pt called stating she is having very bad headaches when she is sitting up for at least 10 minutes she also states the pain travels down to her legs. The pain does subside when she takes Tylenol or when she lays down. Pt is requesting a call back.

## 2020-09-06 ENCOUNTER — Telehealth: Payer: Self-pay | Admitting: *Deleted

## 2020-09-06 DIAGNOSIS — G932 Benign intracranial hypertension: Secondary | ICD-10-CM

## 2020-09-06 NOTE — Telephone Encounter (Addendum)
I spoke to the patient and she verbalized understanding of the findings. She will continue her medications and keep her follow up. States her post-LP headache is much improved w/ the Fioricet sent to the pharmacy by Dr. Epimenio Foot.

## 2020-09-06 NOTE — Telephone Encounter (Signed)
She is taking acetazolamide 250mg , one tablet BID. Says the medication is causing intolerable tingling in her hands, feet and face. She would like to try topiramate (previously had cognitive fog but would like to try again and titrate up slowly). She is asking for 25mg  tablets and try to titrate up to her previous dose of 100mg  QHS.

## 2020-09-06 NOTE — Telephone Encounter (Signed)
-----   Message from Levert Feinstein, MD sent at 09/06/2020  1:57 PM EDT ----- Please let patient know that her LP open pressure was elevated at 32 cm H2O, consistent with diagnosis of idiopathic intracranial hypertension

## 2020-09-06 NOTE — Progress Notes (Signed)
Please let patient know that her LP open pressure was elevated at 32 cm H2O, consistent with diagnosis of idiopathic intracranial hypertension

## 2020-09-07 MED ORDER — TOPIRAMATE 25 MG PO TABS: 100.0000 mg | ORAL_TABLET | Freq: Every day | ORAL | 11 refills | Status: AC

## 2020-09-07 NOTE — Telephone Encounter (Signed)
  Meds ordered this encounter  Medications   topiramate (TOPAMAX) 25 MG tablet    Sig: Take 4 tablets (100 mg total) by mouth at bedtime.    Dispense:  120 tablet    Refill:  11

## 2020-09-07 NOTE — Telephone Encounter (Signed)
The patient is aware the prescription has been sent to the pharmacy. She will slowly titrate her dosage up. She will call us to report any adverse side effects or intolerability.

## 2020-09-07 NOTE — Addendum Note (Signed)
Addended by: Levert Feinstein on: 09/07/2020 09:52 AM   Modules accepted: Orders

## 2020-09-08 NOTE — Telephone Encounter (Signed)
Order sent to [facility].  

## 2020-09-08 NOTE — Telephone Encounter (Signed)
Pt has called to report that she is not worsening but her pain from the Spinal Tap is not any better, please call pt to discuss.

## 2020-09-08 NOTE — Addendum Note (Signed)
Addended by: Lindell Spar C on: 09/08/2020 11:27 AM   Modules accepted: Orders

## 2020-09-08 NOTE — Telephone Encounter (Signed)
I spoke to the patient. She had LP on 09/02/20. She has tried lying flat, rest, caffeine and Fioricet with only mild relief of positional headache. Headache worsens when she tries to stand and walk around. She has underwent blood patch in the past. She would like another one to be ordered. Per vo by Dr. Terrace Arabia, may place order for blood patch. The patient is aware to expect a call.

## 2020-09-09 ENCOUNTER — Ambulatory Visit
Admission: RE | Admit: 2020-09-09 | Discharge: 2020-09-09 | Disposition: A | Discharge status: Home / Self Care | Payer: BC Managed Care – PPO | Source: Ambulatory Visit | Attending: Neurology | Admitting: Neurology

## 2020-09-09 DIAGNOSIS — G971 Other reaction to spinal and lumbar puncture: Secondary | ICD-10-CM

## 2020-09-09 MED ORDER — IOPAMIDOL (ISOVUE-M 200) INJECTION 41%
1.0000 mL | Freq: Once | INTRAMUSCULAR | Status: AC
  Administered 2020-09-09: 1 mL via EPIDURAL

## 2020-09-09 MED ORDER — DIAZEPAM 5 MG PO TABS
10.0000 mg | ORAL_TABLET | Freq: Once | ORAL | Status: AC
  Administered 2020-09-09: 10 mg via ORAL

## 2020-09-09 NOTE — Discharge Instructions (Signed)

## 2020-09-09 NOTE — Progress Notes (Signed)
20 cc of Blood drawn from pts LAC for blood patch procedure. 1 successful attempt. Pt tolerated well. Gauze and tape applied after.

## 2020-09-12 ENCOUNTER — Other Ambulatory Visit: Payer: BC Managed Care – PPO

## 2020-09-12 NOTE — Telephone Encounter (Signed)
LP on 09/02/20. Blood patch 09/09/20. I spoke to the patient. Reports headache has improved but she is still having low back and bilateral hip/leg pain. She has been using ice and acetaminophen. States she had asked the interventional radiologist about her pain when she was getting her blood patch. Says she was told it was due to her LP being traumatic.  She denies any lower extremity weakness. No bladder or bowel issues. No numbness, just pain. I spoke to Dr. Terrace Arabia who agrees the pain is related to the the traumatic tap. She should change over to heating pain and ibuprofen. She is agreeable to this plan.  Additionally, her FMLA paperwork was adjusted to reflect this time off as a one time continuous leave.

## 2020-09-19 ENCOUNTER — Ambulatory Visit (INDEPENDENT_AMBULATORY_CARE_PROVIDER_SITE_OTHER): Payer: BC Managed Care – PPO | Admitting: Neurology

## 2020-09-19 ENCOUNTER — Encounter: Payer: Self-pay | Admitting: Neurology

## 2020-09-19 VITALS — BP 121/87 | HR 95 | Ht 62.5 in | Wt 300.0 lb

## 2020-09-19 DIAGNOSIS — M545 Low back pain, unspecified: Secondary | ICD-10-CM | POA: Diagnosis not present

## 2020-09-19 DIAGNOSIS — G971 Other reaction to spinal and lumbar puncture: Secondary | ICD-10-CM

## 2020-09-19 DIAGNOSIS — G932 Benign intracranial hypertension: Secondary | ICD-10-CM | POA: Diagnosis not present

## 2020-09-19 HISTORY — DX: Other reaction to spinal and lumbar puncture: G97.1

## 2020-09-19 NOTE — Progress Notes (Signed)
Chief Complaint  Patient presents with   Follow-up    Followup:  Had a LP and required blood patch 1 week ago and still having pain at site New Room:  Alone in [place] is a 45 y.o. female   Reported a history of pseudotumor cerebri  She had fluoroscopy guided lumbar puncture in September 02, 2020, Opening pressure was 32 cmH2O  She developed post LP headache, which required blood patch September 09, 2020, which has been helpful  Lumbar puncture did help her headache,  Could not tolerate Diamox, paresthesia, feel like ants crawling on for her face, Topamax, mental slowing,restarting at low-dose 100 mg every night  Advised patient keep her ophthalmology evaluation,  Low back pain  She complains of diffuse body achy pain, low back pain, especially along the lumbar puncture and blood patch site,  MRI of the lumbar with without contrast to rule out structural abnormality  Worsening migraine headaches  She has been using daily Excedrin Migraine, certainly component of medicine rebound headache  Responded well to Ajovy, but has not been compliant with her medications, new prescription was called in  Now back on Topamax 100 mg every night  Also on polypharmacy, Wellbutrin, Lexapro  Maxalt 10 mg dissolvable, suboptimal response, did not like the preparation, will try Imitrex 100 mg as needed, may mix with Zofran, and tizanidine,    DIAGNOSTIC DATA (LABS, IMAGING, TESTING) - I reviewed patient records, labs, notes, testing and imaging myself where available.  MRI of brain on April 26 2017: 1.    There are a couple small T2/FLAIR hyperintense foci in the hemispheres consistent with minimal chronic microvascular ischemic change or the sequela of prior migraine or inflammation.  None of these appear to be acute. 2.    Chronic left maxillary sinusitis. 3.    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  This is usually an incidental finding without  significance but can also be seen in the setting of elevated intracranial pressure.   4.    Multiple subcutaneous cysts, some as large as 24 mm.  These are most likely sebaceous cysts though other cystic structures cannot be ruled out.  If painful or changing in size, consider resection.  Laboratory evaluation in 2022, normal CMP with exception of slightly decreased potassium 3.6, CBC with hemoglobin of 10.9, decreased MCV, MCH, MCHC, elevated RDW   HISTORICAL Christine Hanson is a 45 year old female, seen in refer by vocational rehabilitation service for evaluation of possible intracranial hypertension, initial evaluation was on February 14, 2017.   She recently moved from [place] to [place] in November 2018, she is here to establish her care, she was diagnosed with intracranial hypertension since 2005, also fibromyalgia, was on disability,   Per patient, who presented with frequent headaches, decreased peripheral visual field, there was CSF leaking from her ears, diagnosis of intracranial hypertension was confirmed by repetitive lumbar puncture, she has multiple hospital admission, repeat lumbar puncture 4-5 times each year initially, she was also treated with Diamox 500 mg daily, eventually was tapered of around 2017, because of her stable symptoms, no significant visual complaints anymore.   She was also treated with Lyrica up to 900 mg daily carried a diagnosis of fibromyalgia, she suffered morbid obesity, is seen by the metabolic clinic, with her diet control, she has 30 weight loss over the past few years.   She now complains of frequent headaches, bilateral frontal, temporal  region severe pounding headaches with associated light noise sensitivity, nauseous, for that reason, she is taking daily Sudafed, allergy pill, herb teas have helped her headache some   UPDATE April 01 2017: Laboratory evaluations in January 2019 showed normal CPK, TSH, ESR, ANA, vitamin D, B12, CMP, CBC  -reactive protein was mildly elevated at 14.7,   She was seen by behavior health, was started on wellbutrin 400mg  daily with diagnosis of depression.  She was treated with lamotrigine, she developed rash.  Previously Topamax 150mg  qhs, she felt stuttering, could nto rember her words.    I have given her a prescription of Lyrica, Maxalt as needed, and Topamax prescription at last visit in January 2019, she did not fill any of the prescription, continue complaints of frequent headaches, seem to correlate with his right ear draining,   I also filled the vocational rehabilitation paperwork for her today,   UPDATE August 26 2018: She continue has frequent headaches, weight has been stable, reported normal recent ophthalmology evaluation by Dr. Herschel Senegal showed no evidence of papillary edema, I personally reviewed MRI of brain in April 2019, partial empty sella, no acute intracranial abnormality   She was not able to tolerate Topamax, complains of mental slowing, she has stopped Effexor, it was not helping her headache, she now complains 3-4 headaches each week, holoacranial severe pounding headache with associated light noise sensitivity, extending to her neck, lasting hours to days, she has tried Maxalt used to work well for her, no less effective also cause excessive drowsiness  UPDATE June 30 2020: Previous visit was by Judson Roch, majority are virtual visit during pandemic, patient has not been compliant with her medications, could not tolerate Topamax due to mental slowing, Diamox, feels like ants crossing her face  In addition, she has been taking daily Excedrin Migraine, or in combination of Aleve, Tylenol for her mild headache, about 2-3 times each week, she will get more severe holoacranial pounding headache with light noise sensitivity, nauseous, she does not like the dissolvable preparation of Maxalt,  She has stopped her ajovy 37-month ago, despite reporting significant improvement with ajovy, she has  also missed multiple ophthalmology evaluation  She continues to deal with depression, anxiety, on polypharmacy Wellbutrin, Lexapro, complains of difficulty sleeping  Over the past 2 years, she has steady weight gain of 40 pounds,  She called for today earlier than expected in distress, complains of 5 days history of moderate to severe vortex headache, light noise sensitivity, failed daily Maxalt, Excedrin Migraine, Aleve, Tylenol treatments  She denies visual change, describes this headache is different from her previous pseudotumor headache, when her headache is often getting worse by lying down, this particular headache, she feels better lying down with her eyes closed.  UPDATE September 19 2020:  I reviewed office visit on August 17th 2022 by her, rheumatologist for evaluation of shoulder and hip girdle pain, scalp tenderness, significantly weighted ESR, prolonged symptoms, previous symptomatic improvement on steroid, lack of infectious etiology, suggest polymyalgia rheumatica, perhaps vasculitis,  She was started on methylprednisolone 12mg /day, since September 14, 2020  Laboratory evaluations showed negative ANA, elevated C-reactive protein 29, negative rheumatoid factor, antidouble-stranded DNA, SSA, SSB, RNP antibody, Smith antibody, ANCA, CBC showed hemoglobin of 10.8, RDW of 16.5, vitamin D of 13.4, normal TSH,  Elevated complement C3, C4, ESR 65,  She had fluoroscopy guided lumbar puncture on September 02, 2020, showed opening pressure of 32 cmH2O, had blood patch on September 09, 2020, which has helped her headache, but ever  since then, she complains of worsening low back pain, sensitivity to touch, denies radiating pain, gait abnormality due to pain, no bowel bladder incontinence,  PHYSICAL EXAM:   Vitals:   09/19/20 1456  BP: 121/87  Pulse: 95  Weight: 300 lb (136.1 kg)  Height: 5' 2.5" (1.588 m)    Body mass index is 54 kg/m.  PHYSICAL EXAMNIATION:  Gen: NAD, conversant, well  nourised, well groomed                     Cardiovascular: Regular rate rhythm, no peripheral edema, warm, nontender. Eyes: Conjunctivae clear without exudates or hemorrhage Neck: Supple, no carotid bruits. Pulmonary: Clear to auscultation bilaterally   NEUROLOGICAL EXAM:  MENTAL STATUS: Distressed looking middle-aged female, obese  Speech/cognition: Awake, alert, oriented to history taking and casual conversation   CRANIAL NERVES: CN II: Visual fields are full to confrontation. Pupils are round equal and briskly reactive to light. CN III, IV, VI: extraocular movement are normal. No ptosis. CN V: Facial sensation is intact to light touch CN VII: Face is symmetric with normal eye closure  CN VIII: Hearing is normal to causal conversation. CN IX, X: Phonation is normal. CN XI: Head turning and shoulder shrug are intact  MOTOR: There is no pronator drift of out-stretched arms. Muscle bulk and tone are normal. Muscle strength is normal.  REFLEXES: Reflexes are 1 and symmetric at the biceps, triceps, knees, and absent at ankles. Plantar responses are flexor.  SENSORY: Intact to light touch, pinprick and vibratory sensation are intact in fingers and toes.  COORDINATION: There is no trunk or limb dysmetria noted.  GAIT/STANCE: Need push-up to get up from seated position, limited due to her big body habitus  REVIEW OF SYSTEMS:  Full 14 system review of systems performed and notable only for as above All other review of systems were negative.   ALLERGIES: Allergies  Allergen Reactions   Penicillins Anaphylaxis    Had to be intubated   Clove Flavor Nausea And Vomiting   Lamictal [Lamotrigine]     Severe migraine AND rash   Tramadol Nausea Only and Other (See Comments)    Headache   Tuna Flavor Nausea And Vomiting   Salmon Oil [Nutritional Supplements] Nausea Only    Stomach upset   Topiramate Other (See Comments)    Cognitive fog   Zonegran [Zonisamide] Other (See  Comments)    dizziness    HOME MEDICATIONS: Current Outpatient Medications  Medication Sig Dispense Refill   albuterol (VENTOLIN HFA) 108 (90 Base) MCG/ACT inhaler Inhale 2 puffs into the lungs every 6 (six) hours as needed for wheezing or shortness of breath.     ALPRAZolam (XANAX) 0.5 MG tablet Take 0.5 mg by mouth at bedtime as needed for anxiety.     amphetamine-dextroamphetamine (ADDERALL) 10 MG tablet Take 10 mg by mouth as needed.     b complex vitamins capsule Take 1 capsule by mouth daily.     buPROPion (WELLBUTRIN XL) 150 MG 24 hr tablet Take 1 tablet by mouth every morning.     calcium carbonate (TUMS - DOSED IN MG ELEMENTAL CALCIUM) 500 MG chewable tablet Chew 1 tablet by mouth as needed for indigestion or heartburn.     cetirizine (ZYRTEC) 10 MG tablet Take 10 mg by mouth daily.     EPINEPHrine 0.3 mg/0.3 mL IJ SOAJ injection Inject 0.3 mg into the muscle as needed for anaphylaxis.     escitalopram (LEXAPRO) 10 MG tablet Take  10 mg by mouth daily.      Fremanezumab-vfrm (AJOVY) 225 MG/1.5ML SOAJ Inject 225 mg into the skin every 30 (thirty) days. 1 mL 11   methylPREDNISolone (MEDROL) 4 MG tablet Take 12 mg by mouth daily.     mometasone (NASONEX) 50 MCG/ACT nasal spray Place 2 sprays into the nose in the morning and at bedtime.     montelukast (SINGULAIR) 10 MG tablet Take 10 mg by mouth at bedtime.     omeprazole (PRILOSEC) 40 MG capsule Take 40 mg by mouth 2 (two) times daily.     ondansetron (ZOFRAN ODT) 4 MG disintegrating tablet Take 1 tablet (4 mg total) by mouth every 8 (eight) hours as needed. 20 tablet 6   SUMAtriptan (IMITREX) 100 MG tablet Take 1 tab at onset of migraine.  May repeat in 2 hrs, if needed.  Max dose: 2 tabs/day or 12tabs/month. 36 tablet 1   tiZANidine (ZANAFLEX) 4 MG tablet Take 4 mg by mouth every 6 (six) hours as needed for muscle spasms.     topiramate (TOPAMAX) 25 MG tablet Take 4 tablets (100 mg total) by mouth at bedtime. 120 tablet 11   sodium  chloride (OCEAN) 0.65 % SOLN nasal spray Place 4 sprays into both nostrils every 4 (four) hours. 50 mL 0   No current facility-administered medications for this visit.    PAST MEDICAL HISTORY: Past Medical History:  Diagnosis Date   Asthma    Eczema    Hypertension    Urticaria     PAST SURGICAL HISTORY: Past Surgical History:  Procedure Laterality Date   SEPTOPLASTY WITH ETHMOIDECTOMY, AND MAXILLARY ANTROSTOMY Left 01/05/2020   Procedure: TOTAL ETHMOIDECTOMY, MAXILLARY ANTROSTOMY WITH STRIPPING SPHENOIDOTOMY, FRONTAL SINUSOTOMY WITH TISSUE REMOVAL;  Surgeon: Jason Coop, DO;  Location: [facility];  Service: ENT;  Laterality: Left;   SINUS ENDO W/FUSION Left 01/05/2020   Procedure: ENDOSCOPIC SINUS SURGERY WITH NAVIGATION;  Surgeon: Jason Coop, DO;  Location: [facility];  Service: ENT;  Laterality: Left;   SINUS ENDO WITH FUSION Left 01/05/2020   Procedure: SINUS ENDO WITH FUSION;  Surgeon: Jason Coop, DO;  Location: [facility];  Service: ENT;  Laterality: Left;    FAMILY HISTORY: Family History  Problem Relation Age of Onset   Allergic rhinitis Mother     SOCIAL HISTORY: Social History   Socioeconomic History   Marital status: Significant Other    Spouse name: Not on file   Number of children: Not on file   Years of education: Not on file   Highest education level: Not on file  Occupational History   Not on file  Tobacco Use   Smoking status: Former   Smokeless tobacco: Never  Substance and Sexual Activity   Alcohol use: No   Drug use: No   Sexual activity: Not on file  Other Topics Concern   Not on file  Social History Narrative   Not on file   Social Determinants of Health   Financial Resource Strain: Not on file  Food Insecurity: Not on file  Transportation Needs: Not on file  Physical Activity: Not on file  Stress: Not on file  Social Connections: Not on file  Intimate Partner Violence: Not on file      Marcial Pacas, M.D. Ph.D.  [facility] [address], [place], [place] [address] Ph: [phone] Fax: [phone]  CC:  Willeen Niece, [address],  [place] [id number]  Willeen Niece, [place]

## 2020-09-20 ENCOUNTER — Telehealth: Payer: Self-pay | Admitting: Neurology

## 2020-09-20 NOTE — Telephone Encounter (Signed)
Pt called, having MRI on September 4, need clarity on date to return to work. Want to make sure will be out of work until September 5. Would like a call from the nurse.

## 2020-09-20 NOTE — Telephone Encounter (Signed)
Short-term disability paperwork updated and faxed to [employer] (ph: [phone], fax: [phone]). Confirmation received.

## 2020-09-20 NOTE — Telephone Encounter (Signed)
[facility]) called, providing PA for insurance company; MRI scan lower spinal canal before and after contrast. PA valid for 30 days from today's date.  PA# [id number]

## 2020-09-20 NOTE — Telephone Encounter (Signed)
Medicare no auth.   [employer]: [id number] (exp. 09/20/20 to 10/19/20)   Patient is schedule at GI for 10/02/20.

## 2020-09-21 NOTE — Telephone Encounter (Signed)
Noted, thank you

## 2020-09-22 ENCOUNTER — Other Ambulatory Visit: Payer: Self-pay | Admitting: *Deleted

## 2020-09-22 DIAGNOSIS — G43709 Chronic migraine without aura, not intractable, without status migrainosus: Secondary | ICD-10-CM

## 2020-09-22 MED ORDER — AJOVY 225 MG/1.5ML ~~LOC~~ SOAJ: 225.0000 mg | SUBCUTANEOUS | 3 refills | Status: DC

## 2020-09-22 NOTE — Telephone Encounter (Signed)
The patient also sent a mychart message. I responded to it.  Sent message:  Hi Christine Hanson, I am unable to email the paperwork but I just resent it to the fax number below. I also included your claim number on the cover sheet. I received a confirmation that it went through to them. Let me know if you have any further issues. Sincerely, Marcelino Duster

## 2020-09-22 NOTE — Telephone Encounter (Signed)
Pt called, employer said they do not have the paperwork that you faxed. Would like a call from the nurse.

## 2020-09-27 ENCOUNTER — Telehealth: Payer: Self-pay | Admitting: *Deleted

## 2020-09-27 DIAGNOSIS — Z0289 Encounter for other administrative examinations: Secondary | ICD-10-CM

## 2020-09-27 NOTE — Telephone Encounter (Signed)
Pt called, can not use paperwork faxed. Information need a to be on a new form. Would like a call from the nurse.

## 2020-09-27 NOTE — Telephone Encounter (Signed)
The patient also sent a mychart message. The following response has been sent:  Good Morning, We are happy to fill out different paperwork. Just have your employer fax over what they need to our medical records department at [phone]. We can get completed for you quickly. Sincerely, Marcelino Duster

## 2020-09-27 NOTE — Telephone Encounter (Signed)
I faxed pt [employer] claim form today.Marland Kitchen

## 2020-10-02 ENCOUNTER — Other Ambulatory Visit: Payer: Self-pay

## 2020-10-02 ENCOUNTER — Ambulatory Visit
Admission: RE | Admit: 2020-10-02 | Discharge: 2020-10-02 | Disposition: A | Discharge status: Home / Self Care | Payer: BC Managed Care – PPO | Source: Ambulatory Visit | Attending: Neurology | Admitting: Neurology

## 2020-10-02 DIAGNOSIS — M545 Low back pain, unspecified: Secondary | ICD-10-CM | POA: Diagnosis not present

## 2020-10-02 DIAGNOSIS — G971 Other reaction to spinal and lumbar puncture: Secondary | ICD-10-CM | POA: Diagnosis not present

## 2020-10-02 DIAGNOSIS — G932 Benign intracranial hypertension: Secondary | ICD-10-CM

## 2020-10-02 MED ORDER — GADOBENATE DIMEGLUMINE 529 MG/ML IV SOLN
20.0000 mL | Freq: Once | INTRAVENOUS | Status: AC | PRN
  Administered 2020-10-02: 20 mL via INTRAVENOUS

## 2020-10-04 ENCOUNTER — Telehealth: Payer: Self-pay | Admitting: Neurology

## 2020-10-04 NOTE — Telephone Encounter (Signed)
I spoke to the patient. She verbalized understanding of the following results (sent through [facility]):  Christine Hanson    MRI of lumbar showed mild degenerative changes. There are no evidence of spinal canal or nerve root compression.   If you have any questions about the report, please feel free to call office or MyChart message.   Levert Feinstein, M.D. Ph.D.  ____________________________________  She is going to return to work on 10/05/20.

## 2020-10-04 NOTE — Telephone Encounter (Signed)
Pt called and LVM wanting a call back to discuss her MRI Results.

## 2020-11-03 ENCOUNTER — Ambulatory Visit: Payer: Medicare Other | Admitting: Neurology

## 2020-11-03 ENCOUNTER — Encounter: Payer: Self-pay | Admitting: Neurology

## 2020-11-03 ENCOUNTER — Telehealth: Payer: Self-pay | Admitting: Neurology

## 2020-11-03 ENCOUNTER — Telehealth (INDEPENDENT_AMBULATORY_CARE_PROVIDER_SITE_OTHER): Payer: BC Managed Care – PPO | Admitting: Neurology

## 2020-11-03 DIAGNOSIS — G43709 Chronic migraine without aura, not intractable, without status migrainosus: Secondary | ICD-10-CM

## 2020-11-03 DIAGNOSIS — G932 Benign intracranial hypertension: Secondary | ICD-10-CM

## 2020-11-03 DIAGNOSIS — M545 Low back pain, unspecified: Secondary | ICD-10-CM

## 2020-11-03 MED ORDER — AJOVY 225 MG/1.5ML ~~LOC~~ SOAJ: 225.0000 mg | SUBCUTANEOUS | 3 refills | Status: DC

## 2020-11-03 MED ORDER — CELECOXIB 100 MG PO CAPS: 100.0000 mg | ORAL_CAPSULE | Freq: Two times a day (BID) | ORAL | 5 refills | Status: DC

## 2020-11-03 MED ORDER — TIZANIDINE HCL 4 MG PO TABS: 4.0000 mg | ORAL_TABLET | Freq: Four times a day (QID) | ORAL | 5 refills | Status: AC | PRN

## 2020-11-03 NOTE — Telephone Encounter (Signed)
I called [facility] at [phone].   There is an approval currently on file. [id number] valid through 06/30/2021. Pt has coverage through [facility] plan - LS#[id number]). The patient also has an active commercial plan. Per the rep, the pharmacy will need to process this prescription through her Medicare plan.  I called the patient to provide her with this update. She would like the Ajovy rx sent to [facility]. She is going to use their mail order service. New rx sent. I called [facility] and voided the prescription there.

## 2020-11-03 NOTE — Progress Notes (Signed)
ASSESSMENT AND PLAN  Christine Hanson is a 45 y.o. female   Reported a history of pseudotumor cerebri  She had fluoroscopy guided lumbar puncture in September 02, 2020, Opening pressure was 32 cmH2O  She developed post LP headache, which required blood patch September 09, 2020, which has been helpful  Lumbar puncture did help her headache,  Could not tolerate Diamox, paresthesia, feel like ants crawling on for her face, Topamax, mental slowing,restarting at low-dose 100 mg every night  Advised patient keep her ophthalmology evaluation,  Low back pain  MRI of the lumbar in September 2022 showed mild degenerative changes, no contrast-enhancement, no significant canal foraminal narrowing  Most consistent with lumbar paraspinal muscle spasm, will try Celebrex 100 mg twice a day, can mix with tizanidine as needed,  Warm compression  Referred to physical therapy  Worsening migraine headaches  She has been using daily Excedrin Migraine, certainly component of medicine rebound headache  Responded well to Ajovy, but has not been compliant with her medications, new prescription was called in  Now back on Topamax 100 mg every night  Also on polypharmacy, Wellbutrin, Lexapro  Maxalt 10 mg dissolvable, suboptimal response, did not like the preparation, now taking mitrex 100 mg as needed, may mix with Zofran, and tizanidine,    DIAGNOSTIC DATA (LABS, IMAGING, TESTING) - I reviewed patient records, labs, notes, testing and imaging myself where available.  MRI of brain on April 26 2017: 1.    There are a couple small T2/FLAIR hyperintense foci in the hemispheres consistent with minimal chronic microvascular ischemic change or the sequela of prior migraine or inflammation.  None of these appear to be acute. 2.    Chronic left maxillary sinusitis. 3.    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  This is usually an incidental finding without significance but can also be seen in the setting of elevated  intracranial pressure.   4.    Multiple subcutaneous cysts, some as large as 24 mm.  These are most likely sebaceous cysts though other cystic structures cannot be ruled out.  If painful or changing in size, consider resection.  Laboratory evaluation in 2022, normal CMP with exception of slightly decreased potassium 3.6, CBC with hemoglobin of 10.9, decreased MCV, MCH, MCHC, elevated RDW   HISTORICAL Christine Hanson is a 45 year old female, seen in refer by vocational rehabilitation service for evaluation of possible intracranial hypertension, initial evaluation was on February 14, 2017.   She recently moved from [place] to [place] in November 2018, she is here to establish her care, she was diagnosed with intracranial hypertension since 2005, also fibromyalgia, was on disability,   Per patient, who presented with frequent headaches, decreased peripheral visual field, there was CSF leaking from her ears, diagnosis of intracranial hypertension was confirmed by repetitive lumbar puncture, she has multiple hospital admission, repeat lumbar puncture 4-5 times each year initially, she was also treated with Diamox 500 mg daily, eventually was tapered of around 2017, because of her stable symptoms, no significant visual complaints anymore.   She was also treated with Lyrica up to 900 mg daily carried a diagnosis of fibromyalgia, she suffered morbid obesity, is seen by the metabolic clinic, with her diet control, she has 30 weight loss over the past few years.   She now complains of frequent headaches, bilateral frontal, temporal region severe pounding headaches with associated light noise sensitivity, nauseous, for that reason, she is taking daily Sudafed, allergy pill, herb teas have helped her  headache some   UPDATE April 01 2017: Laboratory evaluations in January 2019 showed normal CPK, TSH, ESR, ANA, vitamin D, B12, CMP, CBC -reactive protein was mildly elevated at 14.7,   She was seen by  behavior health, was started on wellbutrin 400mg  daily with diagnosis of depression.  She was treated with lamotrigine, she developed rash.  Previously Topamax 150mg  qhs, she felt stuttering, could nto rember her words.    I have given her a prescription of Lyrica, Maxalt as needed, and Topamax prescription at last visit in January 2019, she did not fill any of the prescription, continue complaints of frequent headaches, seem to correlate with his right ear draining,   I also filled the vocational rehabilitation paperwork for her today,   UPDATE August 26 2018: She continue has frequent headaches, weight has been stable, reported normal recent ophthalmology evaluation by Dr. Herschel Senegal showed no evidence of papillary edema, I personally reviewed MRI of brain in April 2019, partial empty sella, no acute intracranial abnormality   She was not able to tolerate Topamax, complains of mental slowing, she has stopped Effexor, it was not helping her headache, she now complains 3-4 headaches each week, holoacranial severe pounding headache with associated light noise sensitivity, extending to her neck, lasting hours to days, she has tried Maxalt used to work well for her, no less effective also cause excessive drowsiness  UPDATE June 30 2020: Previous visit was by Judson Roch, majority are virtual visit during pandemic, patient has not been compliant with her medications, could not tolerate Topamax due to mental slowing, Diamox, feels like ants crossing her face  In addition, she has been taking daily Excedrin Migraine, or in combination of Aleve, Tylenol for her mild headache, about 2-3 times each week, she will get more severe holoacranial pounding headache with light noise sensitivity, nauseous, she does not like the dissolvable preparation of Maxalt,  She has stopped her ajovy 65-month ago, despite reporting significant improvement with ajovy, she has also missed multiple ophthalmology evaluation  She continues to  deal with depression, anxiety, on polypharmacy Wellbutrin, Lexapro, complains of difficulty sleeping  Over the past 2 years, she has steady weight gain of 40 pounds,  She called for today earlier than expected in distress, complains of 5 days history of moderate to severe vortex headache, light noise sensitivity, failed daily Maxalt, Excedrin Migraine, Aleve, Tylenol treatments  She denies visual change, describes this headache is different from her previous pseudotumor headache, when her headache is often getting worse by lying down, this particular headache, she feels better lying down with her eyes closed.  UPDATE September 19 2020: I reviewed office visit on August 17th 2022 by her, rheumatologist for evaluation of shoulder and hip girdle pain, scalp tenderness, significantly weighted ESR, prolonged symptoms, previous symptomatic improvement on steroid, lack of infectious etiology, suggest polymyalgia rheumatica, perhaps vasculitis,  She was started on methylprednisolone 12mg /day, since September 14, 2020  Laboratory evaluations showed negative ANA, elevated C-reactive protein 29, negative rheumatoid factor, antidouble-stranded DNA, SSA, SSB, RNP antibody, Smith antibody, ANCA, CBC showed hemoglobin of 10.8, RDW of 16.5, vitamin D of 13.4, normal TSH,  Elevated complement C3, C4, ESR 65,  She had fluoroscopy guided lumbar puncture on September 02, 2020, showed opening pressure of 32 cmH2O, had blood patch on September 09, 2020, which has helped her headache, but ever since then, she complains of worsening low back pain, sensitivity to touch, denies radiating pain, gait abnormality due to pain, no bowel bladder incontinence,  Virtual Visit via video Location: Provider: [place] office; Patient: Home I connected with Christine Hanson  on October 6 by a video enabled telemedicine application and verified that I am speaking with the correct person using two identifiers.  UPDATE  She was not able to go  back to her previous job, cannot sit in the chair for prolonged period of time because severe low back pain, low back muscle spasm, I personally reviewed MRI of lumbar spine on October 02, 2020, mild degenerative changes, disc protrusion at L5-S1, no significant canal or foraminal narrowing  She has been taking over-the-counter NSAIDs every 4 hours, along with muscle relaxant with limited help, tried self muscle stretch yoga without helping her symptoms, gait abnormality because of the pain, no bowel bladder incontinence  She continues to have headache 2-3 times each week, responding well to Imitrex, previously tried and could not tolerate Diamox, Topamax, continue combating obesity,  PHYSICAL EXAM:  PHYSICAL EXAMNIATION:  Gen: NAD, conversant, well nourised, well groomed            NEUROLOGICAL EXAM:  MENTAL STATUS: Distressed looking middle-aged female, obese  Speech/cognition: Awake, alert, oriented to history taking and casual conversation   CRANIAL NERVES: CN II: Visual fields are full to confrontation. Pupils are round equal and briskly reactive to light. CN III, IV, VI: extraocular movement are normal. No ptosis. CN V: Facial sensation is intact to light touch CN VII: Face is symmetric with normal eye closure  CN VIII: Hearing is normal to causal conversation. CN IX, X: Phonation is normal. CN XI: Head turning and shoulder shrug are intact  MOTOR: Moving 4 extremities without difficulties  COORDINATION: There is no trunk or limb dysmetria noted.  GAIT/STANCE: Need push-up to get up from seated position, limited due to her big body habitus, antalgic, guarding her lumbar paraspinal muscles  REVIEW OF SYSTEMS:  Full 14 system review of systems performed and notable only for as above All other review of systems were negative.   ALLERGIES: Allergies  Allergen Reactions   Penicillins Anaphylaxis    Had to be intubated   Clove Flavor Nausea And Vomiting   Lamictal  [Lamotrigine]     Severe migraine AND rash   Tramadol Nausea Only and Other (See Comments)    Headache   Tuna Flavor Nausea And Vomiting   Salmon Oil [Nutritional Supplements] Nausea Only    Stomach upset   Topiramate Other (See Comments)    Cognitive fog   Zonegran [Zonisamide] Other (See Comments)    dizziness    HOME MEDICATIONS: Current Outpatient Medications  Medication Sig Dispense Refill   albuterol (VENTOLIN HFA) 108 (90 Base) MCG/ACT inhaler Inhale 2 puffs into the lungs every 6 (six) hours as needed for wheezing or shortness of breath.     ALPRAZolam (XANAX) 0.5 MG tablet Take 0.5 mg by mouth at bedtime as needed for anxiety.     amphetamine-dextroamphetamine (ADDERALL) 10 MG tablet Take 10 mg by mouth as needed.     b complex vitamins capsule Take 1 capsule by mouth daily.     buPROPion (WELLBUTRIN XL) 150 MG 24 hr tablet Take 1 tablet by mouth every morning.     calcium carbonate (TUMS - DOSED IN MG ELEMENTAL CALCIUM) 500 MG chewable tablet Chew 1 tablet by mouth as needed for indigestion or heartburn.     cetirizine (ZYRTEC) 10 MG tablet Take 10 mg by mouth daily.     EPINEPHrine 0.3 mg/0.3 mL IJ SOAJ injection Inject  0.3 mg into the muscle as needed for anaphylaxis.     escitalopram (LEXAPRO) 10 MG tablet Take 10 mg by mouth daily.      Fremanezumab-vfrm (AJOVY) 225 MG/1.5ML SOAJ Inject 225 mg into the skin every 30 (thirty) days. 3 mL 3   methylPREDNISolone (MEDROL) 4 MG tablet Take 12 mg by mouth daily.     mometasone (NASONEX) 50 MCG/ACT nasal spray Place 2 sprays into the nose in the morning and at bedtime.     montelukast (SINGULAIR) 10 MG tablet Take 10 mg by mouth at bedtime.     omeprazole (PRILOSEC) 40 MG capsule Take 40 mg by mouth 2 (two) times daily.     ondansetron (ZOFRAN ODT) 4 MG disintegrating tablet Take 1 tablet (4 mg total) by mouth every 8 (eight) hours as needed. 20 tablet 6   sodium chloride (OCEAN) 0.65 % SOLN nasal spray Place 4 sprays into both  nostrils every 4 (four) hours. 50 mL 0   SUMAtriptan (IMITREX) 100 MG tablet Take 1 tab at onset of migraine.  May repeat in 2 hrs, if needed.  Max dose: 2 tabs/day or 12tabs/month. 36 tablet 1   tiZANidine (ZANAFLEX) 4 MG tablet Take 4 mg by mouth every 6 (six) hours as needed for muscle spasms.     topiramate (TOPAMAX) 25 MG tablet Take 4 tablets (100 mg total) by mouth at bedtime. 120 tablet 11   No current facility-administered medications for this visit.    PAST MEDICAL HISTORY: Past Medical History:  Diagnosis Date   Asthma    Eczema    Hypertension    Post lumbar puncture headache 09/19/2020   Urticaria     PAST SURGICAL HISTORY: Past Surgical History:  Procedure Laterality Date   SEPTOPLASTY WITH ETHMOIDECTOMY, AND MAXILLARY ANTROSTOMY Left 01/05/2020   Procedure: TOTAL ETHMOIDECTOMY, MAXILLARY ANTROSTOMY WITH STRIPPING SPHENOIDOTOMY, FRONTAL SINUSOTOMY WITH TISSUE REMOVAL;  Surgeon: Jason Coop, DO;  Location: [facility];  Service: ENT;  Laterality: Left;   SINUS ENDO W/FUSION Left 01/05/2020   Procedure: ENDOSCOPIC SINUS SURGERY WITH NAVIGATION;  Surgeon: Jason Coop, DO;  Location: [facility];  Service: ENT;  Laterality: Left;   SINUS ENDO WITH FUSION Left 01/05/2020   Procedure: SINUS ENDO WITH FUSION;  Surgeon: Jason Coop, DO;  Location: [facility];  Service: ENT;  Laterality: Left;    FAMILY HISTORY: Family History  Problem Relation Age of Onset   Allergic rhinitis Mother     SOCIAL HISTORY: Social History   Socioeconomic History   Marital status: Significant Other    Spouse name: Not on file   Number of children: Not on file   Years of education: Not on file   Highest education level: Not on file  Occupational History   Not on file  Tobacco Use   Smoking status: Former   Smokeless tobacco: Never  Substance and Sexual Activity   Alcohol use: No   Drug use: No   Sexual activity: Not on file  Other Topics Concern   Not on file  Social History  Narrative   Not on file   Social Determinants of Health   Financial Resource Strain: Not on file  Food Insecurity: Not on file  Transportation Needs: Not on file  Physical Activity: Not on file  Stress: Not on file  Social Connections: Not on file  Intimate Partner Violence: Not on file      Marcial Pacas, M.D. Ph.D.  [facility] [address], [address]  [place], [place] [address] Ph: [phone] Fax: [phone]  CC:  Willeen Niece, [address] [place],  [place] [id number]  Willeen Niece, [place]

## 2020-11-03 NOTE — Addendum Note (Signed)
Addended by: Lindell Spar C on: 11/03/2020 09:59 AM   Modules accepted: Orders

## 2020-11-03 NOTE — Telephone Encounter (Signed)
Patient reported difficulty getting Ajovy monthly injection from [facility] due to PA related issues, please help her resolve the issues.

## 2020-11-18 ENCOUNTER — Telehealth: Payer: Self-pay | Admitting: Neurology

## 2020-11-18 NOTE — Telephone Encounter (Signed)
[facility] Colin Mulders) request refill for  celecoxib (CELEBREX) 100 MG capsule at [facility] DEL[facility]   Request 90-day supply with up to 3 refills

## 2020-11-21 MED ORDER — CELECOXIB 100 MG PO CAPS: 100.0000 mg | ORAL_CAPSULE | Freq: Two times a day (BID) | ORAL | 0 refills | Status: DC

## 2020-11-21 NOTE — Telephone Encounter (Signed)
Celebrex 100mg  BID 90 day supply sent to [facility].

## 2020-12-12 ENCOUNTER — Ambulatory Visit: Payer: BC Managed Care – PPO | Admitting: Physical Therapy

## 2020-12-20 ENCOUNTER — Ambulatory Visit: Payer: BC Managed Care – PPO | Attending: Neurology | Admitting: Physical Therapy

## 2020-12-31 NOTE — Progress Notes (Signed)
Chart reviewed, agree above plan ?

## 2021-06-19 ENCOUNTER — Telehealth: Payer: Self-pay | Admitting: *Deleted

## 2021-06-19 NOTE — Telephone Encounter (Signed)
PA for Ajovy 225mg  started on covermymeds (key: [id number]). Pharmacy coverage through [facility] [phone]). Case ([id number] approved through 06/19/2022.

## 2021-08-09 ENCOUNTER — Other Ambulatory Visit: Payer: Self-pay | Admitting: Neurology

## 2021-08-09 DIAGNOSIS — G43709 Chronic migraine without aura, not intractable, without status migrainosus: Secondary | ICD-10-CM

## 2021-08-13 ENCOUNTER — Other Ambulatory Visit: Payer: Self-pay | Admitting: Neurology

## 2021-08-23 ENCOUNTER — Other Ambulatory Visit: Payer: Self-pay | Admitting: Neurology

## 2021-09-06 ENCOUNTER — Other Ambulatory Visit: Payer: Self-pay | Admitting: Neurology

## 2021-11-17 ENCOUNTER — Other Ambulatory Visit: Payer: Self-pay | Admitting: Neurology

## 2022-09-18 ENCOUNTER — Other Ambulatory Visit: Payer: Self-pay

## 2022-09-18 DIAGNOSIS — G43709 Chronic migraine without aura, not intractable, without status migrainosus: Secondary | ICD-10-CM

## 2022-10-03 ENCOUNTER — Other Ambulatory Visit: Payer: Self-pay

## 2022-10-03 DIAGNOSIS — G43709 Chronic migraine without aura, not intractable, without status migrainosus: Secondary | ICD-10-CM

## 2022-10-09 ENCOUNTER — Other Ambulatory Visit: Payer: Self-pay

## 2022-10-09 DIAGNOSIS — G43709 Chronic migraine without aura, not intractable, without status migrainosus: Secondary | ICD-10-CM

## 2023-07-03 IMAGING — XA Imaging study
2 series · 2 of 2 positions shown · non-contrast
Comparison: none

CLINICAL DATA: Post lumbar puncture headache.

[Series 14: ortho standard · 1 of 1 slices shown (1 of 2)]
[im 1/1]
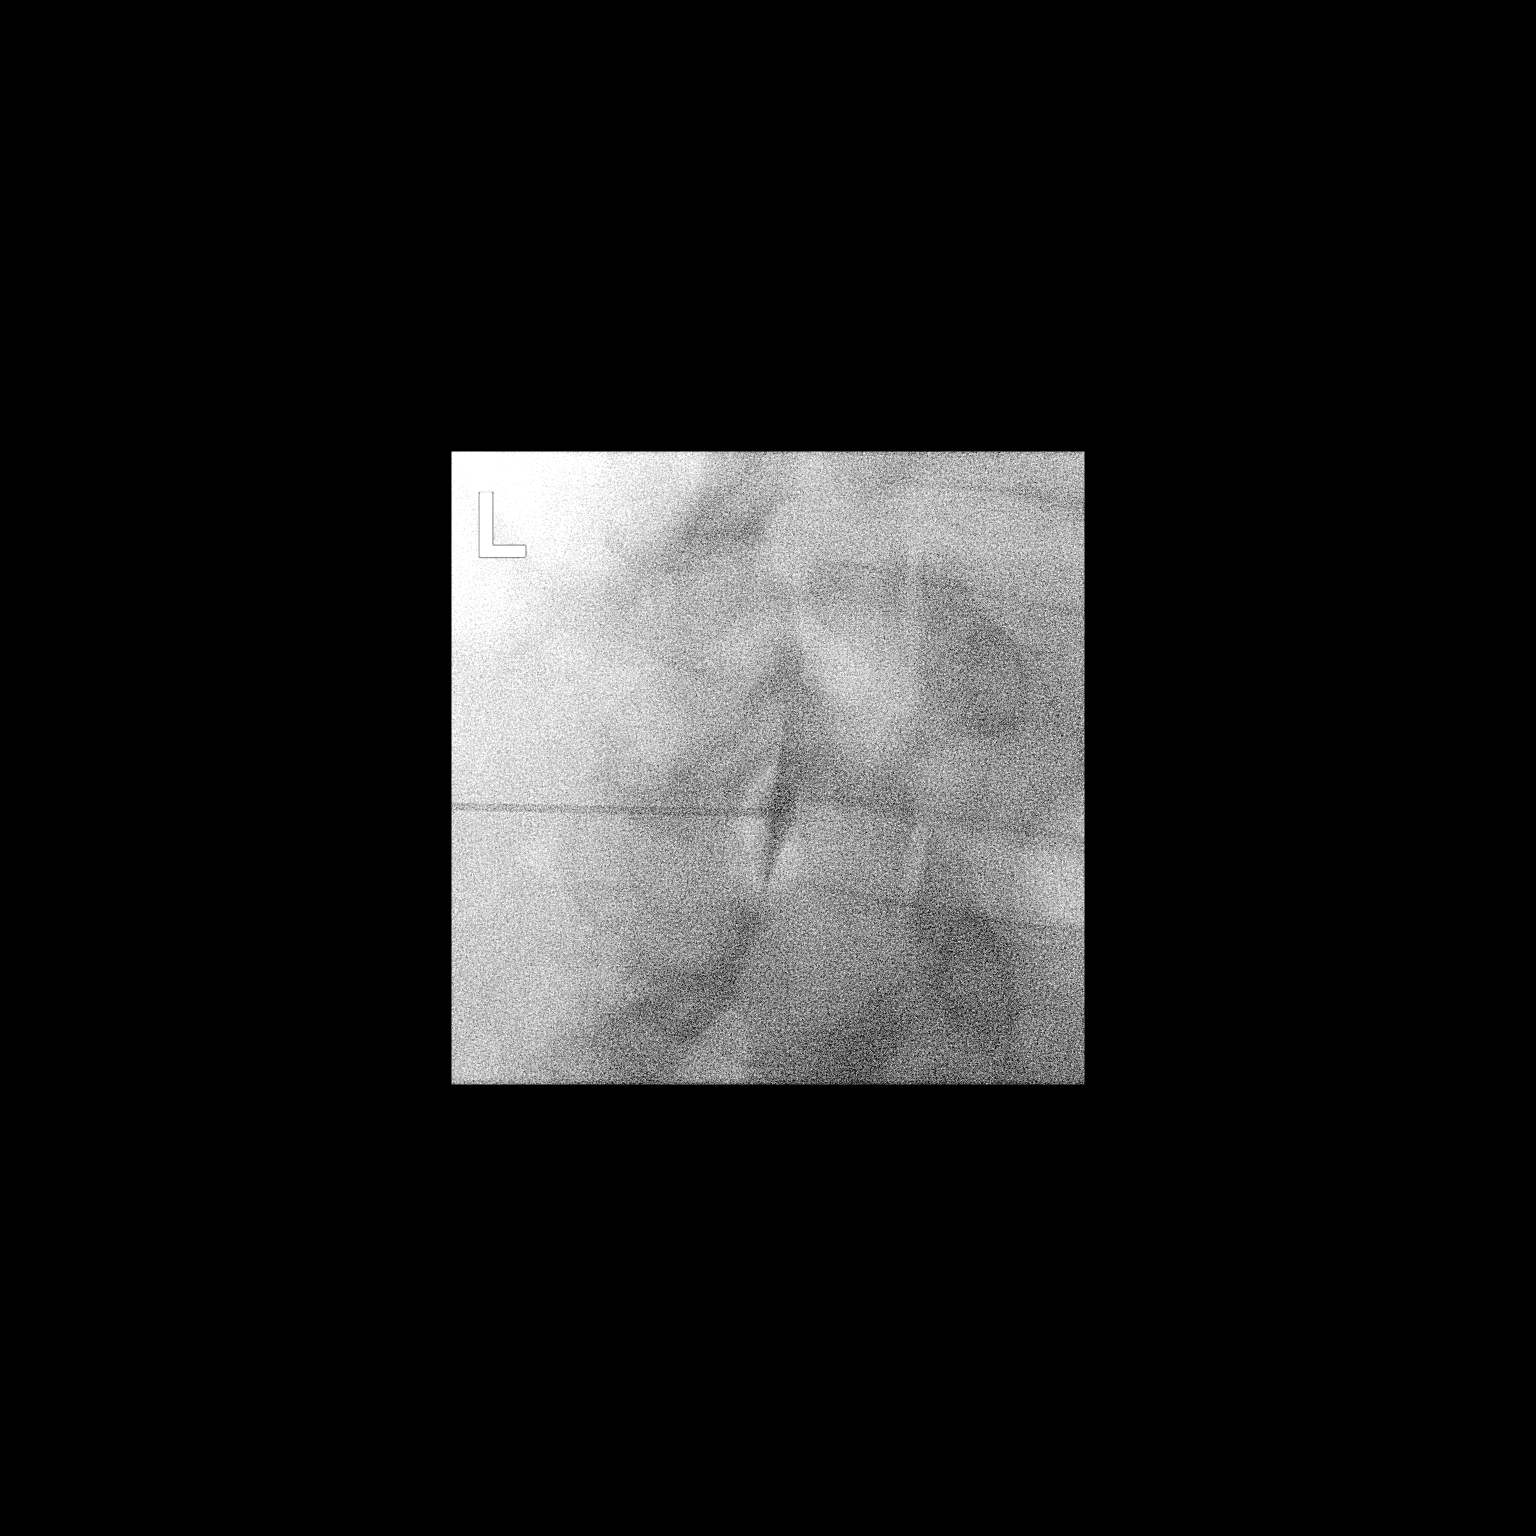

[Series 15: ortho standard · 1 of 1 slices shown (2 of 2)]
[im 1/1]
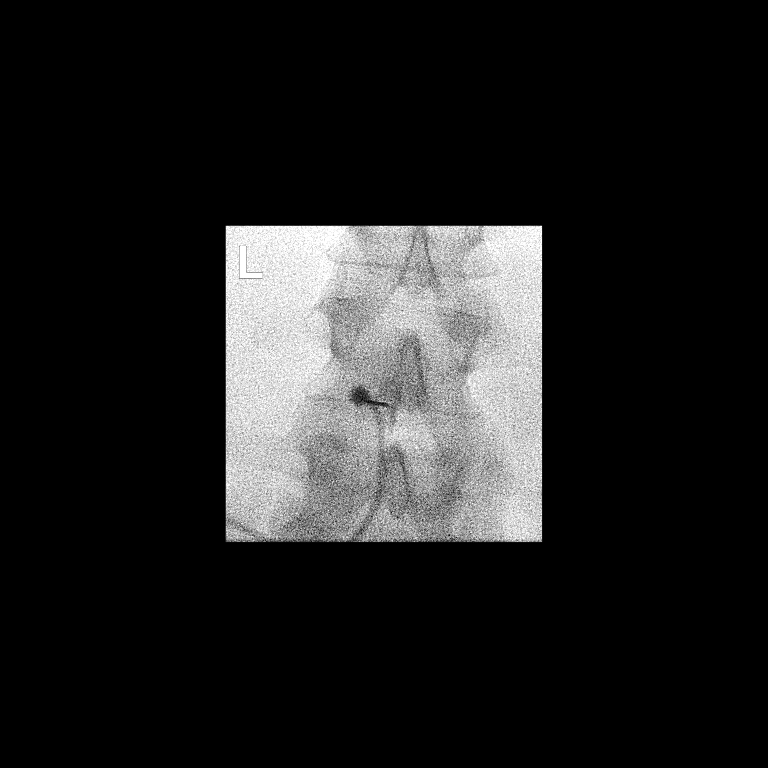

[2 of 2 positions shown; findings below may reference images not displayed]

FLUOROSCOPY TIME:  Radiation Exposure Index (as provided by the
fluoroscopic device): 19.1 mGy

Fluoroscopy Time:  1 minute, 4 seconds

Number of Acquired Images:  0

PROCEDURE:
LUMBAR EPIDURAL BLOOD PATCH INJECTION

After a thorough discussion of risks and benefits of the procedure,
written and verbal consent was obtained.

Prior to the procedure, 20 ml of the patient's blood was harvested
using stringent sterile technique.

An interlaminar approach was performed at L3-L4 on the left. Under
stringent sterile technique, overlying skin was cleansed with
betadine soap and anesthetized with 1% lidocaine without
epinephrine. A 6 inch 20 gauge needle was advanced using
loss-of-resistance technique.

DIAGNOSTIC EPIDURAL INJECTION

Injection of Isovue-M 200 shows a good epidural pattern with spread
above and below the level of needle placement, primarily on the side
of needle placement. No vascular or subarachnoid opacification is
seen.

THERAPEUTIC EPIDURAL INJECTION

15 ml of the patient's blood was injected into the epidural space at
the site of prior lumbar puncture.
IMPRESSION: Technically successful lumbar blood patch at L3-L4.
